# Patient Record
Sex: Female | Born: 1935
Health system: Southern US, Community
[De-identification: ages and names within clinical notes are randomized; demographics above are authoritative.]

## PROBLEM LIST (undated history)

## (undated) DIAGNOSIS — I10 Essential (primary) hypertension: Secondary | ICD-10-CM

## (undated) DIAGNOSIS — M940 Chondrocostal junction syndrome [Tietze]: Secondary | ICD-10-CM

## (undated) DIAGNOSIS — I129 Hypertensive chronic kidney disease with stage 1 through stage 4 chronic kidney disease, or unspecified chronic kidney disease: Secondary | ICD-10-CM

## (undated) DIAGNOSIS — E785 Hyperlipidemia, unspecified: Secondary | ICD-10-CM

## (undated) DIAGNOSIS — M810 Age-related osteoporosis without current pathological fracture: Secondary | ICD-10-CM

## (undated) DIAGNOSIS — N189 Chronic kidney disease, unspecified: Secondary | ICD-10-CM

## (undated) DIAGNOSIS — D509 Iron deficiency anemia, unspecified: Secondary | ICD-10-CM

## (undated) DIAGNOSIS — I340 Nonrheumatic mitral (valve) insufficiency: Secondary | ICD-10-CM

## (undated) DIAGNOSIS — M75102 Unspecified rotator cuff tear or rupture of left shoulder, not specified as traumatic: Secondary | ICD-10-CM

## (undated) DIAGNOSIS — M549 Dorsalgia, unspecified: Secondary | ICD-10-CM

## (undated) HISTORY — DX: Unspecified rotator cuff tear or rupture of left shoulder, not specified as traumatic: M75.102

## (undated) HISTORY — DX: Age-related osteoporosis without current pathological fracture: M81.0

## (undated) HISTORY — DX: Iron deficiency anemia, unspecified: D50.9

## (undated) HISTORY — DX: Chronic kidney disease, unspecified: N18.9

## (undated) HISTORY — PX: ABDOMINAL HYSTERECTOMY: SHX81

## (undated) HISTORY — DX: Hyperlipidemia, unspecified: E78.5

## (undated) HISTORY — PX: CARPAL TUNNEL RELEASE: SHX101

## (undated) HISTORY — DX: Essential (primary) hypertension: I10

## (undated) HISTORY — DX: Hypertensive chronic kidney disease with stage 1 through stage 4 chronic kidney disease, or unspecified chronic kidney disease: I12.9

## (undated) HISTORY — DX: Nonrheumatic mitral (valve) insufficiency: I34.0

## (undated) HISTORY — PX: BLADDER REPAIR: SHX76

## (undated) HISTORY — DX: Dorsalgia, unspecified: M54.9

## (undated) HISTORY — DX: Chondrocostal junction syndrome (tietze): M94.0

---

## 2004-11-22 ENCOUNTER — Inpatient Hospital Stay: Payer: Self-pay | Admitting: Internal Medicine

## 2004-11-22 ENCOUNTER — Other Ambulatory Visit: Payer: Self-pay

## 2004-11-27 ENCOUNTER — Ambulatory Visit: Payer: Self-pay | Admitting: Internal Medicine

## 2004-11-28 ENCOUNTER — Ambulatory Visit: Payer: Self-pay | Admitting: Family Medicine

## 2008-02-11 ENCOUNTER — Ambulatory Visit: Payer: Self-pay | Admitting: Dermatology

## 2013-10-20 ENCOUNTER — Emergency Department: Payer: Self-pay | Admitting: Emergency Medicine

## 2013-10-20 LAB — COMPREHENSIVE METABOLIC PANEL
ALT: 12 U/L (ref 12–78)
ANION GAP: 5 — AB (ref 7–16)
Albumin: 3.8 g/dL (ref 3.4–5.0)
Alkaline Phosphatase: 60 U/L
BUN: 13 mg/dL (ref 7–18)
Bilirubin,Total: 0.3 mg/dL (ref 0.2–1.0)
CALCIUM: 9.1 mg/dL (ref 8.5–10.1)
CHLORIDE: 105 mmol/L (ref 98–107)
CO2: 29 mmol/L (ref 21–32)
Creatinine: 0.75 mg/dL (ref 0.60–1.30)
EGFR (Non-African Amer.): >60
GLUCOSE: 111 mg/dL — AB (ref 65–99)
Osmolality: 278 (ref 275–301)
POTASSIUM: 4.2 mmol/L (ref 3.5–5.1)
SGOT(AST): 12 U/L — ABNORMAL LOW (ref 15–37)
Sodium: 139 mmol/L (ref 136–145)
TOTAL PROTEIN: 7.2 g/dL (ref 6.4–8.2)

## 2013-10-20 LAB — CBC
HCT: 24.3 % — AB (ref 35.0–47.0)
HGB: 6.7 g/dL — ABNORMAL LOW (ref 12.0–16.0)
MCH: 18.1 pg — AB (ref 26.0–34.0)
MCHC: 27.5 g/dL — ABNORMAL LOW (ref 32.0–36.0)
MCV: 66 fL — ABNORMAL LOW (ref 80–100)
Platelet: 409 10*3/uL (ref 150–440)
RBC: 3.69 10*6/uL — AB (ref 3.80–5.20)
RDW: 19.1 % — ABNORMAL HIGH (ref 11.5–14.5)
WBC: 4.9 10*3/uL (ref 3.6–11.0)

## 2013-10-20 LAB — HEMOGLOBIN: HGB: 7.9 g/dL — AB (ref 12.0–16.0)

## 2014-03-23 ENCOUNTER — Ambulatory Visit: Payer: Self-pay

## 2014-03-23 LAB — FERRITIN: Ferritin (ARMC): 16 ng/mL (ref 8–388)

## 2014-03-23 LAB — IRON: IRON: 265 ug/dL — AB (ref 50–170)

## 2014-09-16 ENCOUNTER — Encounter: Payer: Self-pay | Admitting: *Deleted

## 2014-09-16 DIAGNOSIS — N182 Chronic kidney disease, stage 2 (mild): Secondary | ICD-10-CM | POA: Insufficient documentation

## 2014-09-16 DIAGNOSIS — E785 Hyperlipidemia, unspecified: Secondary | ICD-10-CM | POA: Insufficient documentation

## 2014-09-16 DIAGNOSIS — D509 Iron deficiency anemia, unspecified: Secondary | ICD-10-CM | POA: Insufficient documentation

## 2014-09-16 DIAGNOSIS — D649 Anemia, unspecified: Secondary | ICD-10-CM | POA: Insufficient documentation

## 2014-09-16 DIAGNOSIS — M75102 Unspecified rotator cuff tear or rupture of left shoulder, not specified as traumatic: Secondary | ICD-10-CM

## 2014-09-16 DIAGNOSIS — I129 Hypertensive chronic kidney disease with stage 1 through stage 4 chronic kidney disease, or unspecified chronic kidney disease: Secondary | ICD-10-CM

## 2014-09-25 NOTE — Consult Note (Signed)
PATIENT NAME:  Judith Hickman, Judith Hickman MR#:  [id number] DATE OF BIRTH:  1933/08/03  DATE OF CONSULTATION:  10/20/2013  CONSULTING PHYSICIAN:  Pau Banh R. [place] Basham, MD  PRIMARY CARE PHYSICIAN: Steele Sizer, MD  CHIEF COMPLAINT: Weakness, shortness of breath, anemia.   HISTORY OF PRESENT ILLNESS: A 81 year old Caucasian female patient with history of prior gastric ulcer, iron deficiency anemia, sent in by primary care physician for anemia to get blood transfusion. The patient had anemia initially in 2006 when she had an upper GI endoscopy and colonoscopy. Colonoscopy was normal, but upper GI endoscopy showed a small clean-based ulcer. She also got a capsule endoscopy as outpatient. Was transfused blood, later placed on iron pills. These were slowly reduced in dose and 6 months back, her iron pills were completely stopped. She went in today for repeat blood work and hemoglobin was 6.3. Today in the Emergency Room on repeat check, it is 6.7. The patient's hemoglobin baseline seems to be between 7 and 8. Stool Hemoccult done in the ER is negative. Hospitalist team has been consulted for input regarding this case.   The patient does not have any chest pain, palpitations. Does have shortness of breath and severe fatigue. No black stools, hematemesis, nausea, vomiting, abdominal pain. No menorrhagia, vaginal bleed, or hematuria.   PAST MEDICAL HISTORY: 1.  Hypertension.  2.  Carpal tunnel surgery. 3.  Gastric ulcer.  4.  Iron deficiency chronic anemia.  5.  Arthritis.  6.  Hysterectomy.   FAMILY HISTORY: Father died at age of 22 of MI. Mother died secondary to pneumonia and complications at age of 66.   SOCIAL HISTORY: The patient does not smoke. No alcohol. No illicit drugs. Lives alone.   REVIEW OF SYSTEMS:  CONSTITUTIONAL: Complains of fatigue, weakness.  EYES: No blurred vision, pain, redness.  ENT: No tinnitus, ear pain, hearing loss. RESPIRATORY: No cough, wheezing, hemoptysis. CARDIOVASCULAR: No  chest pain, orthopnea, edema.  GASTROINTESTINAL: No nausea, vomiting, diarrhea, abdominal pain.  GENITOURINARY: No dysuria, hematuria, frequency.  ENDOCRINE: No polyuria, nocturia, thyroid problems. HEMATOLOGIC: Has anemia. No bleeding, bruising.  INTEGUMENTARY: No acne, rash, lesion.  MUSCULOSKELETAL: Has arthritis.  NEUROLOGIC: No focal numbness, weakness, seizures.   HOME MEDICATIONS: 1.  Acetaminophen 650 mg 2 times a day as needed.  2.  Evista 60 mg oral once a day. 3.  Fish oil 1000 mg 3 times a day. 4.  Glucosamine 500 mg 2 tablets oral 3 times a day.  5.  Calcium with vitamin D 1 tablet 3 times a day. 6.  Multivitamin 1 tablet daily.   ALLERGIES: No known drug allergies.   PHYSICAL EXAMINATION: VITAL SIGNS: Temperature of 98.5, pulse 62, blood pressure 144/57, saturating 94% on room air.  GENERAL: Elderly Caucasian female patient, looks pale, lying in bed, seems comfortable, conversational, cooperative with exam.  PSYCHIATRIC: Alert and oriented x 3. Mood and affect appropriate. Judgment intact.  HEENT: Atraumatic, normocephalic. Oral mucosa moist and pink. External ears and nose normal. Pallor positive. No icterus. Pupils bilaterally equal and reactive to light.  NECK: Supple. No thyromegaly or palpable lymph nodes. Trachea midline. No carotid bruit, JVD.  CARDIOVASCULAR: S1, S2, without any murmurs. Peripheral pulses 2+. No edema.  RESPIRATORY: Normal work of breathing. Clear to auscultation both sides.  GASTROINTESTINAL: Soft abdomen, nontender. Bowel sounds present. No organomegaly palpable.  SKIN: Warm and dry. No petechiae, rash, ulcers.  MUSCULOSKELETAL: No joint swelling, redness of large joints. Normal muscle tone.  NEUROLOGIC: Motor strength 5/5 in upper and  lower extremities. Sensation to fine touch intact all over.  LYMPHATIC: No cervical lymphadenopathy.   LABORATORY DATA:  Glucose 111, BUN 13, creatinine 0.75, sodium 139, potassium 4.2. AST, ALT, alkaline  phosphatase, and bilirubin normal.   WBC 4.9, hemoglobin 6.7, platelets of 409 with MCV 66.   Blood group of O positive.   Stool Hemoccult is negative.   ASSESSMENT AND PLAN: 291.  A 81 year old lady with chronic iron deficiency anemia. The patient's iron pills were stopped 6 months prior. Her hemoglobin baseline seems to be between 7 to 8. I have discussed case with Dr. Scotty CourtStafford of Emergency Room. Will transfuse a unit of blood here in the Emergency Room,  restart her iron pills, and discharge home to follow up with her primary care physician. She does not have any acute bleeding. Vitals are stable. The patient will need repeat lab work in 2 to 4 weeks with primary care physician.  2.  Hypertension: Continue medications.   TIME SPENT TODAY ON THIS CONSULT: 45 minutes.    ____________________________ Molinda BailiffSrikar R. Henlee Donovan, MD srs:jcm D: 10/20/2013 19:54:02 ET T: 10/20/2013 21:11:16 ET JOB#: [id number]  cc: Wardell HeathSrikar R. Elpidio AnisSudini, MD, <Dictator> Steele SizerMark A. Crissman, MD Orie FishermanSRIKAR R Kale Dols MD ELECTRONICALLY SIGNED 10/30/2013 14:14

## 2014-11-08 ENCOUNTER — Other Ambulatory Visit: Payer: Self-pay | Admitting: Unknown Physician Specialty

## 2014-11-10 ENCOUNTER — Ambulatory Visit: Payer: Self-pay | Admitting: Unknown Physician Specialty

## 2014-11-17 DIAGNOSIS — M810 Age-related osteoporosis without current pathological fracture: Secondary | ICD-10-CM | POA: Insufficient documentation

## 2014-11-17 DIAGNOSIS — Z90710 Acquired absence of both cervix and uterus: Secondary | ICD-10-CM | POA: Insufficient documentation

## 2014-11-19 ENCOUNTER — Ambulatory Visit (INDEPENDENT_AMBULATORY_CARE_PROVIDER_SITE_OTHER): Payer: Medicare PPO | Admitting: Unknown Physician Specialty

## 2014-11-19 ENCOUNTER — Encounter: Payer: Self-pay | Admitting: Unknown Physician Specialty

## 2014-11-19 VITALS — BP 180/74 | HR 59 | Temp 98.3°F | Ht <= 58 in | Wt 136.2 lb

## 2014-11-19 DIAGNOSIS — N182 Chronic kidney disease, stage 2 (mild): Secondary | ICD-10-CM | POA: Diagnosis not present

## 2014-11-19 DIAGNOSIS — N183 Chronic kidney disease, stage 3 (moderate): Secondary | ICD-10-CM | POA: Diagnosis not present

## 2014-11-19 DIAGNOSIS — N184 Chronic kidney disease, stage 4 (severe): Secondary | ICD-10-CM | POA: Diagnosis not present

## 2014-11-19 DIAGNOSIS — N185 Chronic kidney disease, stage 5: Secondary | ICD-10-CM

## 2014-11-19 DIAGNOSIS — N189 Chronic kidney disease, unspecified: Secondary | ICD-10-CM | POA: Diagnosis not present

## 2014-11-19 DIAGNOSIS — I129 Hypertensive chronic kidney disease with stage 1 through stage 4 chronic kidney disease, or unspecified chronic kidney disease: Secondary | ICD-10-CM

## 2014-11-19 DIAGNOSIS — N181 Chronic kidney disease, stage 1: Secondary | ICD-10-CM | POA: Diagnosis not present

## 2014-11-19 LAB — MICROALBUMIN, URINE WAIVED
CREATININE, URINE WAIVED: 200 mg/dL (ref 10–300)
MICROALB, UR WAIVED: 30 mg/L — AB (ref 0–19)
Microalb/Creat Ratio: <30 mg/g (ref <30)

## 2014-11-19 MED ORDER — LOSARTAN POTASSIUM-HCTZ 50-12.5 MG PO TABS: 1.0000 | ORAL_TABLET | Freq: Every day | ORAL | Status: DC

## 2014-11-19 MED ORDER — AMLODIPINE BESYLATE 10 MG PO TABS: 10.0000 mg | ORAL_TABLET | Freq: Every day | ORAL | Status: DC

## 2014-11-19 MED ORDER — METOPROLOL SUCCINATE ER 50 MG PO TB24: 50.0000 mg | ORAL_TABLET | Freq: Every day | ORAL | Status: DC

## 2014-11-19 NOTE — Patient Instructions (Signed)
Hypertension Hypertension, commonly called high blood pressure, is when the force of blood pumping through your arteries is too strong. Your arteries are the blood vessels that carry blood from your heart throughout your body. A blood pressure reading consists of a higher number over a lower number, such as 110/72. The higher number (systolic) is the pressure inside your arteries when your heart pumps. The lower number (diastolic) is the pressure inside your arteries when your heart relaxes. Ideally you want your blood pressure below 120/80. Hypertension forces your heart to work harder to pump blood. Your arteries may become narrow or stiff. Having hypertension puts you at risk for heart disease, stroke, and other problems.  RISK FACTORS Some risk factors for high blood pressure are controllable. Others are not.  Risk factors you cannot control include:   Race. You may be at higher risk if you are African American.  Age. Risk increases with age.  Gender. Men are at higher risk than women before age 45 years. After age 65, women are at higher risk than men. Risk factors you can control include:  Not getting enough exercise or physical activity.  Being overweight.  Getting too much fat, sugar, calories, or salt in your diet.  Drinking too much alcohol. SIGNS AND SYMPTOMS Hypertension does not usually cause signs or symptoms. Extremely high blood pressure (hypertensive crisis) may cause headache, anxiety, shortness of breath, and nosebleed. DIAGNOSIS  To check if you have hypertension, your health care provider will measure your blood pressure while you are seated, with your arm held at the level of your heart. It should be measured at least twice using the same arm. Certain conditions can cause a difference in blood pressure between your right and left arms. A blood pressure reading that is higher than normal on one occasion does not mean that you need treatment. If one blood pressure reading  is high, ask your health care provider about having it checked again. TREATMENT  Treating high blood pressure includes making lifestyle changes and possibly taking medicine. Living a healthy lifestyle can help lower high blood pressure. You may need to change some of your habits. Lifestyle changes may include:  Following the DASH diet. This diet is high in fruits, vegetables, and whole grains. It is low in salt, red meat, and added sugars.  Getting at least 2 hours of brisk physical activity every week.  Losing weight if necessary.  Not smoking.  Limiting alcoholic beverages.  Learning ways to reduce stress. If lifestyle changes are not enough to get your blood pressure under control, your health care provider may prescribe medicine. You may need to take more than one. Work closely with your health care provider to understand the risks and benefits. HOME CARE INSTRUCTIONS  Have your blood pressure rechecked as directed by your health care provider.   Take medicines only as directed by your health care provider. Follow the directions carefully. Blood pressure medicines must be taken as prescribed. The medicine does not work as well when you skip doses. Skipping doses also puts you at risk for problems.   Do not smoke.   Monitor your blood pressure at home as directed by your health care provider. SEEK MEDICAL CARE IF:   You think you are having a reaction to medicines taken.  You have recurrent headaches or feel dizzy.  You have swelling in your ankles.  You have trouble with your vision. SEEK IMMEDIATE MEDICAL CARE IF:  You develop a severe headache or confusion.    You have unusual weakness, numbness, or feel faint.  You have severe chest or abdominal pain.  You vomit repeatedly.  You have trouble breathing. MAKE SURE YOU:   Understand these instructions.  Will watch your condition.  Will get help right away if you are not doing well or get worse. Document  Released: 05/21/2005 Document Revised: 10/05/2013 Document Reviewed: 03/13/2013 ExitCare Patient Information 2015 [employer]. This information is not intended to replace advice given to you by your health care provider. Make sure you discuss any questions you have with your health care provider. DASH Eating Plan DASH stands for "Dietary Approaches to Stop Hypertension." The DASH eating plan is a healthy eating plan that has been shown to reduce high blood pressure (hypertension). Additional health benefits may include reducing the risk of type 2 diabetes mellitus, heart disease, and stroke. The DASH eating plan may also help with weight loss. WHAT DO I NEED TO KNOW ABOUT THE DASH EATING PLAN? For the DASH eating plan, you will follow these general guidelines:  Choose foods with a percent daily value for sodium of less than 5% (as listed on the food label).  Use salt-free seasonings or herbs instead of table salt or sea salt.  Check with your health care provider or pharmacist before using salt substitutes.  Eat lower-sodium products, often labeled as "lower sodium" or "no salt added."  Eat fresh foods.  Eat more vegetables, fruits, and low-fat dairy products.  Choose whole grains. Look for the word "whole" as the first word in the ingredient list.  Choose fish and skinless chicken or turkey more often than red meat. Limit fish, poultry, and meat to 6 oz (170 g) each day.  Limit sweets, desserts, sugars, and sugary drinks.  Choose heart-healthy fats.  Limit cheese to 1 oz (28 g) per day.  Eat more home-cooked food and less restaurant, buffet, and fast food.  Limit fried foods.  Cook foods using methods other than frying.  Limit canned vegetables. If you do use them, rinse them well to decrease the sodium.  When eating at a restaurant, ask that your food be prepared with less salt, or no salt if possible. WHAT FOODS CAN I EAT? Seek help from a dietitian for individual  calorie needs. Grains Whole grain or whole wheat bread. Brown rice. Whole grain or whole wheat pasta. Quinoa, bulgur, and whole grain cereals. Low-sodium cereals. Corn or whole wheat flour tortillas. Whole grain cornbread. Whole grain crackers. Low-sodium crackers. Vegetables Fresh or frozen vegetables (raw, steamed, roasted, or grilled). Low-sodium or reduced-sodium tomato and vegetable juices. Low-sodium or reduced-sodium tomato sauce and paste. Low-sodium or reduced-sodium canned vegetables.  Fruits All fresh, canned (in natural juice), or frozen fruits. Meat and Other Protein Products Ground beef (85% or leaner), grass-fed beef, or beef trimmed of fat. Skinless chicken or turkey. Ground chicken or turkey. Pork trimmed of fat. All fish and seafood. Eggs. Dried beans, peas, or lentils. Unsalted nuts and seeds. Unsalted canned beans. Dairy Low-fat dairy products, such as skim or 1% milk, 2% or reduced-fat cheeses, low-fat ricotta or cottage cheese, or plain low-fat yogurt. Low-sodium or reduced-sodium cheeses. Fats and Oils Tub margarines without trans fats. Light or reduced-fat mayonnaise and salad dressings (reduced sodium). Avocado. Safflower, olive, or canola oils. Natural peanut or almond butter. Other Unsalted popcorn and pretzels. The items listed above may not be a complete list of recommended foods or beverages. Contact your dietitian for more options. WHAT FOODS ARE NOT RECOMMENDED? Grains White bread.   White pasta. White rice. Refined cornbread. Bagels and croissants. Crackers that contain trans fat. Vegetables Creamed or fried vegetables. Vegetables in a cheese sauce. Regular canned vegetables. Regular canned tomato sauce and paste. Regular tomato and vegetable juices. Fruits Dried fruits. Canned fruit in light or heavy syrup. Fruit juice. Meat and Other Protein Products Fatty cuts of meat. Ribs, chicken wings, bacon, sausage, bologna, salami, chitterlings, fatback, hot dogs,  bratwurst, and packaged luncheon meats. Salted nuts and seeds. Canned beans with salt. Dairy Whole or 2% milk, cream, half-and-half, and cream cheese. Whole-fat or sweetened yogurt. Full-fat cheeses or blue cheese. Nondairy creamers and whipped toppings. Processed cheese, cheese spreads, or cheese curds. Condiments Onion and garlic salt, seasoned salt, table salt, and sea salt. Canned and packaged gravies. Worcestershire sauce. Tartar sauce. Barbecue sauce. Teriyaki sauce. Soy sauce, including reduced sodium. Steak sauce. Fish sauce. Oyster sauce. Cocktail sauce. Horseradish. Ketchup and mustard. Meat flavorings and tenderizers. Bouillon cubes. Hot sauce. Tabasco sauce. Marinades. Taco seasonings. Relishes. Fats and Oils Butter, stick margarine, lard, shortening, ghee, and bacon fat. Coconut, palm kernel, or palm oils. Regular salad dressings. Other Pickles and olives. Salted popcorn and pretzels. The items listed above may not be a complete list of foods and beverages to avoid. Contact your dietitian for more information. WHERE CAN I FIND MORE INFORMATION? National Heart, Lung, and Blood Institute: www.nhlbi.nih.gov/health/health-topics/topics/dash/ Document Released: 05/10/2011 Document Revised: 10/05/2013 Document Reviewed: 03/25/2013 ExitCare Patient Information 2015 [employer]. This information is not intended to replace advice given to you by your health care provider. Make sure you discuss any questions you have with your health care provider.  

## 2014-11-19 NOTE — Progress Notes (Signed)
   BP 180/74 mmHg  Pulse 59  Temp(Src) 98.3 F (36.8 C)  Ht 4\' 10"  (1.473 m)  Wt 136 lb 3.2 oz (61.78 kg)  BMI 28.47 kg/m2  SpO2 96%  LMP  (LMP Unknown)   Subjective:    Patient ID: Judith Hickman, female    DOB: 12-Nov-1933, 81 y.o.   MRN: [id number]  HPI: Judith Hickman is a 81 y.o. female  Chief Complaint  Patient presents with  . Hypertension   Hypertension This is a chronic problem. The problem is uncontrolled. Pertinent negatives include no anxiety, blurred vision, chest pain, headaches, malaise/fatigue, neck pain, orthopnea, palpitations, peripheral edema, PND or sweats. There are no compliance problems.    BP is 132/62 at home  Relevant past medical, surgical, family and social history reviewed and updated as indicated. Interim medical history since our last visit reviewed. Allergies and medications reviewed and updated.  Review of Systems  Constitutional: Negative for malaise/fatigue.  Eyes: Negative for blurred vision.  Cardiovascular: Negative for chest pain, palpitations, orthopnea and PND.  Musculoskeletal: Negative for neck pain.  Neurological: Negative for headaches.    Per HPI unless specifically indicated above     Objective:    BP 180/74 mmHg  Pulse 59  Temp(Src) 98.3 F (36.8 C)  Ht 4\' 10"  (1.473 m)  Wt 136 lb 3.2 oz (61.78 kg)  BMI 28.47 kg/m2  SpO2 96%  LMP  (LMP Unknown)  Wt Readings from Last 3 Encounters:  11/19/14 136 lb 3.2 oz (61.78 kg)  06/08/14 135 lb (61.236 kg)  06/08/14 135 lb (61.236 kg)    Physical Exam  Constitutional: She is oriented to person, place, and time. She appears well-developed and well-nourished. No distress.  HENT:  Head: Normocephalic and atraumatic.  Eyes: Conjunctivae and lids are normal. Right eye exhibits no discharge. Left eye exhibits no discharge. No scleral icterus.  Cardiovascular: Normal rate and regular rhythm.   Murmur heard. Long standing systolic murmer  Pulmonary/Chest: Effort normal and  breath sounds normal. No respiratory distress.  Abdominal: Normal appearance. There is no splenomegaly or hepatomegaly.  Musculoskeletal: Normal range of motion.  Neurological: She is alert and oriented to person, place, and time.  Skin: Skin is intact. No rash noted. No pallor.  Psychiatric: She has a normal mood and affect. Her behavior is normal. Judgment and thought content normal.      Assessment & Plan:   Problem List Items Addressed This Visit      Genitourinary   Benign hypertensive kidney disease with chronic kidney disease - Primary    Pt with poorly controlled BP today.  Will add Losartan/HCTZ to present treatment.  Refill other BP medications.        Relevant Orders   Microalbumin, Urine Waived   Uric acid   Comprehensive metabolic panel   Lipid Panel w/o Chol/HDL Ratio       Follow up plan: Return in about 6 weeks (around 12/31/2014).

## 2014-11-19 NOTE — Assessment & Plan Note (Signed)
Pt with poorly controlled BP today.  Will add Losartan/HCTZ to present treatment.  Refill other BP medications.

## 2014-11-20 LAB — COMPREHENSIVE METABOLIC PANEL
ALBUMIN: 4.2 g/dL (ref 3.5–4.8)
ALT: 9 IU/L (ref 0–32)
AST: 15 IU/L (ref 0–40)
Albumin/Globulin Ratio: 1.6 (ref 1.1–2.5)
Alkaline Phosphatase: 65 IU/L (ref 39–117)
BUN/Creatinine Ratio: 26 (ref 11–26)
BUN: 17 mg/dL (ref 8–27)
CO2: 26 mmol/L (ref 18–29)
Calcium: 9.9 mg/dL (ref 8.7–10.3)
Chloride: 100 mmol/L (ref 97–108)
Creatinine, Ser: 0.66 mg/dL (ref 0.57–1.00)
GFR calc Af Amer: 98 mL/min/{1.73_m2} (ref >59)
GFR, EST NON AFRICAN AMERICAN: 85 mL/min/{1.73_m2} (ref >59)
Globulin, Total: 2.7 g/dL (ref 1.5–4.5)
Glucose: 100 mg/dL — ABNORMAL HIGH (ref 65–99)
POTASSIUM: 4.9 mmol/L (ref 3.5–5.2)
SODIUM: 141 mmol/L (ref 134–144)
Total Protein: 6.9 g/dL (ref 6.0–8.5)

## 2014-11-20 LAB — LIPID PANEL W/O CHOL/HDL RATIO
CHOLESTEROL TOTAL: 237 mg/dL — AB (ref 100–199)
HDL: 53 mg/dL (ref >39)
LDL CALC: 138 mg/dL — AB (ref 0–99)
TRIGLYCERIDES: 231 mg/dL — AB (ref 0–149)
VLDL Cholesterol Cal: 46 mg/dL — ABNORMAL HIGH (ref 5–40)

## 2014-11-20 LAB — URIC ACID: Uric Acid: 5.6 mg/dL (ref 2.5–7.1)

## 2014-11-22 ENCOUNTER — Telehealth: Payer: Self-pay

## 2014-11-22 NOTE — Telephone Encounter (Signed)
Tried to call patient but there was no answer and the voicemail was not set up so I was not able to leave a message. Will try again later.

## 2014-11-22 NOTE — Telephone Encounter (Signed)
-----   Message from Gabriel Cirri, NP sent at 11/22/2014  9:34 AM EDT ----- Call tell labs are normal except high cholesterol.  It is not recommended she take cholesterol medications but should work on diet and exercise

## 2014-11-23 ENCOUNTER — Telehealth: Payer: Self-pay | Admitting: Unknown Physician Specialty

## 2014-11-23 MED ORDER — TRIAMTERENE-HCTZ 50-25 MG PO CAPS: 1.0000 | ORAL_CAPSULE | ORAL | Status: DC

## 2014-11-23 NOTE — Telephone Encounter (Signed)
Pt called stated the new RX Judith Hickman started her on Friday has caused her bottom lip to swell. Pt stated she would like to have another RX called in for. Please contact pt with any issues or concerns. Pharm is [facility] on [address]. Please call pt @ [phone]. Thanks.

## 2014-11-23 NOTE — Telephone Encounter (Signed)
Tried to call patient but the voicemail was not set up so I was not able to leave a message. Will try again later.

## 2014-11-23 NOTE — Telephone Encounter (Signed)
Patient was notified of results.  

## 2014-11-23 NOTE — Telephone Encounter (Signed)
Another medication sent.  Ask her to please let us know if she has a reaction to that one.  Any symptoms of throat tightness or closing or shortness of breath, call 911

## 2014-11-23 NOTE — Telephone Encounter (Signed)
Routing to provider. The medication given to patient was the metoporolol succinate.

## 2014-11-23 NOTE — Telephone Encounter (Signed)
Called and notified patient about new medication that was sent.

## 2014-12-08 ENCOUNTER — Telehealth: Payer: Self-pay | Admitting: Unknown Physician Specialty

## 2014-12-08 MED ORDER — HYDROCHLOROTHIAZIDE 12.5 MG PO CAPS: 12.5000 mg | ORAL_CAPSULE | Freq: Every day | ORAL | Status: DC

## 2014-12-08 NOTE — Telephone Encounter (Signed)
I spoke with granddaughter; she is a Engineer, civil (consulting)nurse; believes BP may be dropping too low with new medicine orthostatics have been done in the office already STOP the triamterene/hctz 50/25 and start just hctz 12.5 mg We discussed risk of stroke, falls Offered to have her come in to check her cuff against ours Next appt is about 3 weeks away; call before then if any issues at all Granddaughter will call pt and tell her plan

## 2014-12-08 NOTE — Telephone Encounter (Signed)
Pt's granddaughter called stated she has been taking a new BP medication and since she has been taking the medication she has been feeling light headed and dizzy. Would like to know if dosage can be lowered. Please call pt @ [phone] ASAP. Pt's cell # is [phone]. Thanks.

## 2014-12-08 NOTE — Telephone Encounter (Signed)
Routing to provider. Pharmacy is [facility] on [address].

## 2014-12-11 ENCOUNTER — Other Ambulatory Visit: Payer: Self-pay | Admitting: Unknown Physician Specialty

## 2014-12-31 ENCOUNTER — Other Ambulatory Visit: Payer: Self-pay | Admitting: Unknown Physician Specialty

## 2014-12-31 ENCOUNTER — Encounter: Payer: Self-pay | Admitting: Unknown Physician Specialty

## 2014-12-31 ENCOUNTER — Ambulatory Visit (INDEPENDENT_AMBULATORY_CARE_PROVIDER_SITE_OTHER): Payer: Medicare PPO | Admitting: Unknown Physician Specialty

## 2014-12-31 VITALS — BP 150/72 | HR 59 | Temp 98.2°F | Ht 58.6 in | Wt 130.6 lb

## 2014-12-31 DIAGNOSIS — N183 Chronic kidney disease, stage 3 (moderate): Secondary | ICD-10-CM | POA: Diagnosis not present

## 2014-12-31 DIAGNOSIS — N182 Chronic kidney disease, stage 2 (mild): Secondary | ICD-10-CM

## 2014-12-31 DIAGNOSIS — I129 Hypertensive chronic kidney disease with stage 1 through stage 4 chronic kidney disease, or unspecified chronic kidney disease: Secondary | ICD-10-CM | POA: Diagnosis not present

## 2014-12-31 DIAGNOSIS — N185 Chronic kidney disease, stage 5: Secondary | ICD-10-CM

## 2014-12-31 DIAGNOSIS — N189 Chronic kidney disease, unspecified: Secondary | ICD-10-CM

## 2014-12-31 DIAGNOSIS — N181 Chronic kidney disease, stage 1: Secondary | ICD-10-CM | POA: Diagnosis not present

## 2014-12-31 DIAGNOSIS — N184 Chronic kidney disease, stage 4 (severe): Secondary | ICD-10-CM | POA: Diagnosis not present

## 2014-12-31 MED ORDER — RALOXIFENE HCL 60 MG PO TABS: 60.0000 mg | ORAL_TABLET | Freq: Every day | ORAL | Status: DC

## 2014-12-31 MED ORDER — METOPROLOL SUCCINATE ER 50 MG PO TB24: 50.0000 mg | ORAL_TABLET | Freq: Every day | ORAL | Status: DC

## 2014-12-31 MED ORDER — AMLODIPINE BESYLATE 10 MG PO TABS
10.0000 mg | ORAL_TABLET | Freq: Every day | ORAL | Status: DC
Start: 2014-12-31 — End: 2015-11-08

## 2014-12-31 MED ORDER — HYDROCHLOROTHIAZIDE 12.5 MG PO CAPS: 12.5000 mg | ORAL_CAPSULE | Freq: Every day | ORAL | Status: DC

## 2014-12-31 NOTE — Assessment & Plan Note (Signed)
Stable on present meds.

## 2014-12-31 NOTE — Progress Notes (Signed)
BP 150/72 mmHg  Pulse 59  Temp(Src) 98.2 F (36.8 C)  Ht 4' 10.6" (1.488 m)  Wt 130 lb 9.6 oz (59.24 kg)  BMI 26.76 kg/m2  SpO2 97%  LMP  (LMP Unknown)   Subjective:    Patient ID: Judith Hickman, female    DOB: 05/23/1934, 81 y.o.   MRN: [id number]  HPI: Judith Hickman is a 81 y.o. female  Chief Complaint  Patient presents with  . Hypertension   BP meds adjusted last visit but pt got dizzy.  Her daughter felt her BP was too low.  HCTZ 12.5 mg was called in and Losartan discontinued.  She is doing well.  No chest pain or SOB.  Taking BP meds daily.    Relevant past medical, surgical, family and social history reviewed and updated as indicated. Interim medical history since our last visit reviewed. Allergies and medications reviewed and updated.  Review of Systems  Per HPI unless specifically indicated above     Objective:    BP 150/72 mmHg  Pulse 59  Temp(Src) 98.2 F (36.8 C)  Ht 4' 10.6" (1.488 m)  Wt 130 lb 9.6 oz (59.24 kg)  BMI 26.76 kg/m2  SpO2 97%  LMP  (LMP Unknown)  Wt Readings from Last 3 Encounters:  12/31/14 130 lb 9.6 oz (59.24 kg)  11/19/14 136 lb 3.2 oz (61.78 kg)  06/08/14 135 lb (61.236 kg)    Physical Exam  Constitutional: She is oriented to person, place, and time. She appears well-developed and well-nourished. No distress.  HENT:  Head: Normocephalic and atraumatic.  Eyes: Conjunctivae and lids are normal. Right eye exhibits no discharge. Left eye exhibits no discharge. No scleral icterus.  Cardiovascular: Normal rate, regular rhythm and normal heart sounds.   Pulmonary/Chest: Effort normal and breath sounds normal. No respiratory distress.  Abdominal: Normal appearance and bowel sounds are normal. She exhibits no distension. There is no splenomegaly or hepatomegaly. There is no tenderness.  Musculoskeletal: Normal range of motion.  Neurological: She is alert and oriented to person, place, and time.  Skin: Skin is intact. No rash  noted. No pallor.  Psychiatric: She has a normal mood and affect. Her behavior is normal. Judgment and thought content normal.    Results for orders placed or performed in visit on 11/19/14  Microalbumin, Urine Waived  Result Value Ref Range   Microalb, Ur Waived 30 (H) 0 - 19 mg/L   Creatinine, Urine Waived 200 10 - 300 mg/dL   Microalb/Creat Ratio <30 <30 mg/g  Uric acid  Result Value Ref Range   Uric Acid 5.6 2.5 - 7.1 mg/dL  Comprehensive metabolic panel  Result Value Ref Range   Glucose 100 (H) 65 - 99 mg/dL   BUN 17 8 - 27 mg/dL   Creatinine, Ser 1.91 0.57 - 1.00 mg/dL   GFR calc non Af Amer 85 >59 mL/min/1.73   GFR calc Af Amer 98 >59 mL/min/1.73   BUN/Creatinine Ratio 26 11 - 26   Sodium 141 134 - 144 mmol/L   Potassium 4.9 3.5 - 5.2 mmol/L   Chloride 100 97 - 108 mmol/L   CO2 26 18 - 29 mmol/L   Calcium 9.9 8.7 - 10.3 mg/dL   Total Protein 6.9 6.0 - 8.5 g/dL   Albumin 4.2 3.5 - 4.8 g/dL   Globulin, Total 2.7 1.5 - 4.5 g/dL   Albumin/Globulin Ratio 1.6 1.1 - 2.5   Bilirubin Total <0.2 0.0 - 1.2 mg/dL   Alkaline  Phosphatase 65 39 - 117 IU/L   AST 15 0 - 40 IU/L   ALT 9 0 - 32 IU/L  Lipid Panel w/o Chol/HDL Ratio  Result Value Ref Range   Cholesterol, Total 237 (H) 100 - 199 mg/dL   Triglycerides 161 (H) 0 - 149 mg/dL   HDL 53 >09 mg/dL   VLDL Cholesterol Cal 46 (H) 5 - 40 mg/dL   LDL Calculated 604 (H) 0 - 99 mg/dL      Assessment & Plan:   Problem List Items Addressed This Visit      Unprioritized   Benign hypertensive kidney disease with chronic kidney disease - Primary    Stable on present meds.            Follow up plan: Return in about 6 months (around 07/03/2015) for physical.

## 2015-02-22 DIAGNOSIS — M1710 Unilateral primary osteoarthritis, unspecified knee: Secondary | ICD-10-CM | POA: Diagnosis not present

## 2015-02-22 DIAGNOSIS — Z6827 Body mass index (BMI) 27.0-27.9, adult: Secondary | ICD-10-CM | POA: Diagnosis not present

## 2015-02-22 DIAGNOSIS — E663 Overweight: Secondary | ICD-10-CM | POA: Diagnosis not present

## 2015-02-22 DIAGNOSIS — I10 Essential (primary) hypertension: Secondary | ICD-10-CM | POA: Diagnosis not present

## 2015-02-22 DIAGNOSIS — E785 Hyperlipidemia, unspecified: Secondary | ICD-10-CM | POA: Diagnosis not present

## 2015-02-22 DIAGNOSIS — M810 Age-related osteoporosis without current pathological fracture: Secondary | ICD-10-CM | POA: Diagnosis not present

## 2015-06-28 ENCOUNTER — Encounter: Payer: Medicare PPO | Admitting: Unknown Physician Specialty

## 2015-07-27 ENCOUNTER — Ambulatory Visit (INDEPENDENT_AMBULATORY_CARE_PROVIDER_SITE_OTHER): Payer: Medicare PPO | Admitting: Unknown Physician Specialty

## 2015-07-27 ENCOUNTER — Encounter: Payer: Self-pay | Admitting: Unknown Physician Specialty

## 2015-07-27 VITALS — BP 152/81 | HR 60 | Temp 98.3°F | Ht <= 58 in | Wt 134.6 lb

## 2015-07-27 DIAGNOSIS — Z0000 Encounter for general adult medical examination without abnormal findings: Secondary | ICD-10-CM

## 2015-07-27 DIAGNOSIS — Z23 Encounter for immunization: Secondary | ICD-10-CM

## 2015-07-27 DIAGNOSIS — I10 Essential (primary) hypertension: Secondary | ICD-10-CM | POA: Insufficient documentation

## 2015-07-27 DIAGNOSIS — E1122 Type 2 diabetes mellitus with diabetic chronic kidney disease: Secondary | ICD-10-CM | POA: Diagnosis not present

## 2015-07-27 DIAGNOSIS — D509 Iron deficiency anemia, unspecified: Secondary | ICD-10-CM | POA: Diagnosis not present

## 2015-07-27 DIAGNOSIS — N182 Chronic kidney disease, stage 2 (mild): Secondary | ICD-10-CM

## 2015-07-27 NOTE — Assessment & Plan Note (Signed)
Not quite to goal but typical for her

## 2015-07-27 NOTE — Patient Instructions (Signed)
Advance Directive Advance directives are the legal documents that allow you to make choices about your health care and medical treatment if you cannot speak for yourself. Advance directives are a way for you to communicate your wishes to family, friends, and health care providers. The specified people can then convey your decisions about end-of-life care to avoid confusion if you should become unable to communicate. Ideally, the process of discussing and writing advance directives should happen over time rather than making decisions all at once. Advance directives can be modified as your situation changes, and you can change your mind at any time, even after you have signed the advance directives. Each state has its own laws regarding advance directives. You may want to check with your health care provider, attorney, or state representative about the law in your state. Below are some examples of advance directives. LIVING WILL A living will is a set of instructions documenting your wishes about medical care when you cannot care for yourself. It is used if you become:  Terminally ill.  Incapacitated.  Unable to communicate.  Unable to make decisions. Items to consider in your living will include:  The use or non-use of life-sustaining equipment, such as dialysis machines and breathing machines (ventilators).  A do not resuscitate (DNR) order, which is the instruction not to use cardiopulmonary resuscitation (CPR) if breathing or heartbeat stops.  Tube feeding.  Withholding of food and fluids.  Comfort (palliative) care when the goal becomes comfort rather than a cure.  Organ and tissue donation. A living will does not give instructions about distribution of your money and property if you should pass away. It is advisable to seek the expert advice of a lawyer in drawing up a will regarding your possessions. Decisions about taxes, beneficiaries, and asset distribution will be legally binding.  This process can relieve your family and friends of any burdens surrounding disputes or questions that may come up about the allocation of your assets. DO NOT RESUSCITATE (DNR) A do not resuscitate (DNR) order is a request to not have CPR in the event that your heart stops beating or you stop breathing. Unless given other instructions, a health care provider will try to help any patient whose heart has stopped or who has stopped breathing.  HEALTH CARE PROXY AND DURABLE POWER OF ATTORNEY FOR HEALTH CARE A health care proxy is a person (agent) appointed to make medical decisions for you if you cannot. Generally, people choose someone they know well and trust to represent their preferences when they can no longer do so. You should be sure to ask this person for agreement to act as your agent. An agent may have to exercise judgment in the event of a medical decision for which your wishes are not known. The durable power of attorney for health care is the legal document that names your health care proxy. Once written, it should be:  Signed.  Notarized.  Dated.  Copied.  Witnessed.  Incorporated into your medical record. You may also want to appoint someone to manage your financial affairs if you cannot. This is called a durable power of attorney for finances. It is a separate legal document from the durable power of attorney for health care. You may choose the same person or someone different from your health care proxy to act as your agent in financial matters.   This information is not intended to replace advice given to you by your health care provider. Make sure you discuss any   questions you have with your health care provider.   Document Released: 08/28/2007 Document Revised: 05/26/2013 Document Reviewed: 10/08/2012 Elsevier Interactive Patient Education 2016 [employer]  

## 2015-07-27 NOTE — Progress Notes (Signed)
BP 152/81 mmHg  Pulse 60  Temp(Src) 98.3 F (36.8 C)  Ht 4' 9.7" (1.466 m)  Wt 134 lb 9.6 oz (61.054 kg)  BMI 28.41 kg/m2  SpO2 94%  LMP  (LMP Unknown)   Subjective:    Patient ID: Judith Hickman, female    DOB: December 15, 1933, 82 y.o.   MRN: [id number]  HPI: Judith Hickman is a 82 y.o. female  Chief Complaint  Patient presents with  . Medicare Wellness   Functional Status Survey: Is the patient deaf or have difficulty hearing?: Yes Does the patient have difficulty seeing, even when wearing glasses/contacts?: No Does the patient have difficulty concentrating, remembering, or making decisions?: No Does the patient have difficulty walking or climbing stairs?: No Does the patient have difficulty dressing or bathing?: No Does the patient have difficulty doing errands alone such as visiting a doctor's office or shopping?: No  Fall Risk  07/27/2015  Falls in the past year? No   Depression screen [facility] 2/9 07/27/2015 12/31/2014 11/19/2014  Decreased Interest 0 0 0  Down, Depressed, Hopeless 0 0 0  PHQ - 2 Score 0 0 0    Hypertension Using medications without difficulty Average home BPs   No problems or lightheadedness No chest pain with exertion or shortness of breath No Edema  No living will or POA.  Pt states she would like her granddaughter Morrie Sheldon to make decisions for her  Relevant past medical, surgical, family and social history reviewed and updated as indicated. Interim medical history since our last visit reviewed. Allergies and medications reviewed and updated.  Review of Systems  Per HPI unless specifically indicated above     Objective:    BP 152/81 mmHg  Pulse 60  Temp(Src) 98.3 F (36.8 C)  Ht 4' 9.7" (1.466 m)  Wt 134 lb 9.6 oz (61.054 kg)  BMI 28.41 kg/m2  SpO2 94%  LMP  (LMP Unknown)  Wt Readings from Last 3 Encounters:  07/27/15 134 lb 9.6 oz (61.054 kg)  12/31/14 130 lb 9.6 oz (59.24 kg)  11/19/14 136 lb 3.2 oz (61.78 kg)    Physical Exam   Constitutional: She is oriented to person, place, and time. She appears well-developed and well-nourished. No distress.  HENT:  Head: Normocephalic and atraumatic.  Eyes: Conjunctivae and lids are normal. Right eye exhibits no discharge. Left eye exhibits no discharge. No scleral icterus.  Neck: Normal range of motion. Neck supple. No JVD present. Carotid bruit is not present.  Cardiovascular: Normal rate, regular rhythm and normal heart sounds.   Pulmonary/Chest: Effort normal and breath sounds normal.  Abdominal: Normal appearance. There is no splenomegaly or hepatomegaly.  Musculoskeletal: Normal range of motion.  Neurological: She is alert and oriented to person, place, and time.  Skin: Skin is warm, dry and intact. No rash noted. No pallor.  Psychiatric: She has a normal mood and affect. Her behavior is normal. Judgment and thought content normal.    Results for orders placed or performed in visit on 11/19/14  Microalbumin, Urine Waived  Result Value Ref Range   Microalb, Ur Waived 30 (H) 0 - 19 mg/L   Creatinine, Urine Waived 200 10 - 300 mg/dL   Microalb/Creat Ratio <30 <30 mg/g  Uric acid  Result Value Ref Range   Uric Acid 5.6 2.5 - 7.1 mg/dL  Comprehensive metabolic panel  Result Value Ref Range   Glucose 100 (H) 65 - 99 mg/dL   BUN 17 8 - 27 mg/dL  Creatinine, Ser 0.66 0.57 - 1.00 mg/dL   GFR calc non Af Amer 85 >59 mL/min/1.73   GFR calc Af Amer 98 >59 mL/min/1.73   BUN/Creatinine Ratio 26 11 - 26   Sodium 141 134 - 144 mmol/L   Potassium 4.9 3.5 - 5.2 mmol/L   Chloride 100 97 - 108 mmol/L   CO2 26 18 - 29 mmol/L   Calcium 9.9 8.7 - 10.3 mg/dL   Total Protein 6.9 6.0 - 8.5 g/dL   Albumin 4.2 3.5 - 4.8 g/dL   Globulin, Total 2.7 1.5 - 4.5 g/dL   Albumin/Globulin Ratio 1.6 1.1 - 2.5   Bilirubin Total <0.2 0.0 - 1.2 mg/dL   Alkaline Phosphatase 65 39 - 117 IU/L   AST 15 0 - 40 IU/L   ALT 9 0 - 32 IU/L  Lipid Panel w/o Chol/HDL Ratio  Result Value Ref Range    Cholesterol, Total 237 (H) 100 - 199 mg/dL   Triglycerides 161 (H) 0 - 149 mg/dL   HDL 53 >09 mg/dL   VLDL Cholesterol Cal 46 (H) 5 - 40 mg/dL   LDL Calculated 604 (H) 0 - 99 mg/dL      Assessment & Plan:   Problem List Items Addressed This Visit      Unprioritized   Fe deficiency anemia    Check CBC      Essential hypertension, benign    Not quite to goal but typical for her      Relevant Orders   Comprehensive metabolic panel   Lipid Panel w/o Chol/HDL Ratio   CKD stage 2 due to type 2 diabetes mellitus (HCC)   Relevant Orders   CBC with Differential/Platelet    Other Visit Diagnoses    Need for pneumococcal vaccination    -  Primary    Relevant Orders    Pneumococcal conjugate vaccine 13-valent IM (Completed)    Routine general medical examination at a health care facility            Follow up plan: Return in about 3 months (around 10/24/2015).

## 2015-07-27 NOTE — Assessment & Plan Note (Signed)
Check CBC 

## 2015-07-28 LAB — CBC WITH DIFFERENTIAL/PLATELET
BASOS: 1 %
Basophils Absolute: 0 10*3/uL (ref 0.0–0.2)
EOS (ABSOLUTE): 0.1 10*3/uL (ref 0.0–0.4)
EOS: 2 %
HEMOGLOBIN: 13 g/dL (ref 11.1–15.9)
Hematocrit: 39.7 % (ref 34.0–46.6)
IMMATURE GRANS (ABS): 0 10*3/uL (ref 0.0–0.1)
IMMATURE GRANULOCYTES: 0 %
Lymphocytes Absolute: 1.9 10*3/uL (ref 0.7–3.1)
Lymphs: 32 %
MCH: 28.6 pg (ref 26.6–33.0)
MCHC: 32.7 g/dL (ref 31.5–35.7)
MCV: 87 fL (ref 79–97)
MONOCYTES: 5 %
Monocytes Absolute: 0.3 10*3/uL (ref 0.1–0.9)
NEUTROS PCT: 60 %
Neutrophils Absolute: 3.5 10*3/uL (ref 1.4–7.0)
PLATELETS: 293 10*3/uL (ref 150–379)
RBC: 4.55 x10E6/uL (ref 3.77–5.28)
RDW: 14.3 % (ref 12.3–15.4)
WBC: 5.9 10*3/uL (ref 3.4–10.8)

## 2015-07-28 LAB — COMPREHENSIVE METABOLIC PANEL
ALBUMIN: 4.4 g/dL (ref 3.5–4.8)
ALT: 12 IU/L (ref 0–32)
AST: 16 IU/L (ref 0–40)
Albumin/Globulin Ratio: 1.5 (ref 1.1–2.5)
Alkaline Phosphatase: 65 IU/L (ref 39–117)
BUN/Creatinine Ratio: 21 (ref 11–26)
BUN: 15 mg/dL (ref 8–27)
Bilirubin Total: 0.2 mg/dL (ref 0.0–1.2)
CHLORIDE: 99 mmol/L (ref 96–106)
CO2: 26 mmol/L (ref 18–29)
Calcium: 9.7 mg/dL (ref 8.7–10.3)
Creatinine, Ser: 0.7 mg/dL (ref 0.57–1.00)
GFR calc non Af Amer: 83 mL/min/{1.73_m2} (ref >59)
GFR, EST AFRICAN AMERICAN: 95 mL/min/{1.73_m2} (ref >59)
GLUCOSE: 98 mg/dL (ref 65–99)
Globulin, Total: 2.9 g/dL (ref 1.5–4.5)
Potassium: 4.6 mmol/L (ref 3.5–5.2)
Sodium: 141 mmol/L (ref 134–144)
TOTAL PROTEIN: 7.3 g/dL (ref 6.0–8.5)

## 2015-07-28 LAB — LIPID PANEL W/O CHOL/HDL RATIO
CHOLESTEROL TOTAL: 153 mg/dL (ref 100–199)
HDL: 58 mg/dL (ref >39)
LDL CALC: 69 mg/dL (ref 0–99)
Triglycerides: 129 mg/dL (ref 0–149)
VLDL CHOLESTEROL CAL: 26 mg/dL (ref 5–40)

## 2015-07-29 ENCOUNTER — Encounter: Payer: Self-pay | Admitting: Unknown Physician Specialty

## 2015-07-29 NOTE — Progress Notes (Signed)
Quick Note:  Normal labs. Patient notified by letter. ______ 

## 2015-10-25 ENCOUNTER — Ambulatory Visit: Payer: Medicare PPO | Admitting: Unknown Physician Specialty

## 2015-11-08 ENCOUNTER — Ambulatory Visit (INDEPENDENT_AMBULATORY_CARE_PROVIDER_SITE_OTHER): Payer: Medicare PPO | Admitting: Unknown Physician Specialty

## 2015-11-08 ENCOUNTER — Encounter: Payer: Self-pay | Admitting: Unknown Physician Specialty

## 2015-11-08 VITALS — BP 144/64 | HR 56 | Temp 98.2°F | Ht 59.0 in | Wt 134.4 lb

## 2015-11-08 DIAGNOSIS — I10 Essential (primary) hypertension: Secondary | ICD-10-CM | POA: Diagnosis not present

## 2015-11-08 MED ORDER — HYDROCHLOROTHIAZIDE 12.5 MG PO CAPS: 12.5000 mg | ORAL_CAPSULE | Freq: Every day | ORAL | Status: DC

## 2015-11-08 MED ORDER — AMLODIPINE BESYLATE 10 MG PO TABS: 10.0000 mg | ORAL_TABLET | Freq: Every day | ORAL | Status: DC

## 2015-11-08 MED ORDER — METOPROLOL SUCCINATE ER 50 MG PO TB24: 50.0000 mg | ORAL_TABLET | Freq: Every day | ORAL | Status: DC

## 2015-11-08 NOTE — Assessment & Plan Note (Signed)
Stable, continue present medications.   

## 2015-11-08 NOTE — Progress Notes (Signed)
BP 144/64 mmHg  Pulse 56  Temp(Src) 98.2 F (36.8 C)  Ht  (1.499 m)  Wt 134 lb 6.4 oz (60.963 kg)  BMI 27.13 kg/m2  SpO2 95%  LMP  (LMP Unknown)   Subjective:    Patient ID: Judith Hickman, female    DOB: [birth year], 82 y.o.   MRN: [id number]  HPI: Judith Hickman is a 82 y.o. female  Chief Complaint  Patient presents with  . Follow-up  . Hypertension   Hypertension Using medications without difficulty Average home BPs 143/74  No problems or lightheadedness No chest pain with exertion or shortness of breath No Edema    Relevant past medical, surgical, family and social history reviewed and updated as indicated. Interim medical history since our last visit reviewed. Allergies and medications reviewed and updated.  Review of Systems  Per HPI unless specifically indicated above     Objective:    BP 144/64 mmHg  Pulse 56  Temp(Src) 98.2 F (36.8 C)  Ht  (1.499 m)  Wt 134 lb 6.4 oz (60.963 kg)  BMI 27.13 kg/m2  SpO2 95%  LMP  (LMP Unknown)  Wt Readings from Last 3 Encounters:  11/08/15 134 lb 6.4 oz (60.963 kg)  07/27/15 134 lb 9.6 oz (61.054 kg)  12/31/14 130 lb 9.6 oz (59.24 kg)    Physical Exam  Constitutional: She is oriented to person, place, and time. She appears well-developed and well-nourished. No distress.  HENT:  Head: Normocephalic and atraumatic.  Eyes: Conjunctivae and lids are normal. Right eye exhibits no discharge. Left eye exhibits no discharge. No scleral icterus.  Neck: Normal range of motion. Neck supple. No JVD present. Carotid bruit is not present.  Cardiovascular: Normal rate, regular rhythm and normal heart sounds.   Pulmonary/Chest: Effort normal and breath sounds normal.  Abdominal: Normal appearance. There is no splenomegaly or hepatomegaly.  Musculoskeletal: Normal range of motion.  Neurological: She is alert and oriented to person, place, and time.  Skin: Skin is warm, dry and intact. No rash noted. No pallor.   Psychiatric: She has a normal mood and affect. Her behavior is normal. Judgment and thought content normal.    Results for orders placed or performed in visit on 07/27/15  CBC with Differential/Platelet  Result Value Ref Range   WBC 5.9 3.4 - 10.8 x10E3/uL   RBC 4.55 3.77 - 5.28 x10E6/uL   Hemoglobin 13.0 11.1 - 15.9 g/dL   Hematocrit 59.5 63.8 - 46.6 %   MCV 87 79 - 97 fL   MCH 28.6 26.6 - 33.0 pg   MCHC 32.7 31.5 - 35.7 g/dL   RDW 75.6 43.3 - 29.5 %   Platelets 293 150 - 379 x10E3/uL   Neutrophils 60 %   Lymphs 32 %   Monocytes 5 %   Eos 2 %   Basos 1 %   Neutrophils Absolute 3.5 1.4 - 7.0 x10E3/uL   Lymphocytes Absolute 1.9 0.7 - 3.1 x10E3/uL   Monocytes Absolute 0.3 0.1 - 0.9 x10E3/uL   EOS (ABSOLUTE) 0.1 0.0 - 0.4 x10E3/uL   Basophils Absolute 0.0 0.0 - 0.2 x10E3/uL   Immature Granulocytes 0 %   Immature Grans (Abs) 0.0 0.0 - 0.1 x10E3/uL  Comprehensive metabolic panel  Result Value Ref Range   Glucose 98 65 - 99 mg/dL   BUN 15 8 - 27 mg/dL   Creatinine, Ser 1.88 0.57 - 1.00 mg/dL   GFR calc non Af Amer 83 >59 mL/min/1.73  GFR calc Af Amer 95 >59 mL/min/1.73   BUN/Creatinine Ratio 21 11 - 26   Sodium 141 134 - 144 mmol/L   Potassium 4.6 3.5 - 5.2 mmol/L   Chloride 99 96 - 106 mmol/L   CO2 26 18 - 29 mmol/L   Calcium 9.7 8.7 - 10.3 mg/dL   Total Protein 7.3 6.0 - 8.5 g/dL   Albumin 4.4 3.5 - 4.8 g/dL   Globulin, Total 2.9 1.5 - 4.5 g/dL   Albumin/Globulin Ratio 1.5 1.1 - 2.5   Bilirubin Total 0.2 0.0 - 1.2 mg/dL   Alkaline Phosphatase 65 39 - 117 IU/L   AST 16 0 - 40 IU/L   ALT 12 0 - 32 IU/L  Lipid Panel w/o Chol/HDL Ratio  Result Value Ref Range   Cholesterol, Total 153 100 - 199 mg/dL   Triglycerides 161129 0 - 149 mg/dL   HDL 58 >09>39 mg/dL   VLDL Cholesterol Cal 26 5 - 40 mg/dL   LDL Calculated 69 0 - 99 mg/dL      Assessment & Plan:   Problem List Items Addressed This Visit      Unprioritized   Essential hypertension, benign - Primary    Stable,  continue present medications.            Follow up plan: Return in about 6 months (around 05/09/2016).

## 2016-01-08 ENCOUNTER — Other Ambulatory Visit: Payer: Self-pay | Admitting: Unknown Physician Specialty

## 2016-04-13 ENCOUNTER — Other Ambulatory Visit: Payer: Self-pay | Admitting: Family Medicine

## 2016-04-13 NOTE — Telephone Encounter (Signed)
Your patient 

## 2016-05-09 ENCOUNTER — Encounter: Payer: Self-pay | Admitting: Unknown Physician Specialty

## 2016-05-09 ENCOUNTER — Ambulatory Visit (INDEPENDENT_AMBULATORY_CARE_PROVIDER_SITE_OTHER): Payer: Medicare PPO | Admitting: Unknown Physician Specialty

## 2016-05-09 DIAGNOSIS — I10 Essential (primary) hypertension: Secondary | ICD-10-CM

## 2016-05-09 NOTE — Assessment & Plan Note (Signed)
Good BP for her age at home.  Will continue present medications

## 2016-05-09 NOTE — Progress Notes (Signed)
BP (!) 166/76 (BP Location: Left Arm, Cuff Size: Normal)   Pulse (!) 55   Temp 97.8 F (36.6 C)   Ht 4' 10.8" (1.494 m)   Wt 131 lb 6.4 oz (59.6 kg)   LMP  (LMP Unknown)   SpO2 96%   BMI 26.72 kg/m    Subjective:    Patient ID: Judith Hickman, female    DOB: [birth year], 82 y.o.   MRN: [id number]  HPI: Judith Hickman is a 82 y.o. female  Chief Complaint  Patient presents with  . Hypertension   Hypertension Using medications without difficulty Average home BPs 141/73   No problems or lightheadedness No chest pain with exertion or shortness of breath No Edema   Relevant past medical, surgical, family and social history reviewed and updated as indicated. Interim medical history since our last visit reviewed. Allergies and medications reviewed and updated.  Review of Systems  Per HPI unless specifically indicated above     Objective:    BP (!) 166/76 (BP Location: Left Arm, Cuff Size: Normal)   Pulse (!) 55   Temp 97.8 F (36.6 C)   Ht 4' 10.8" (1.494 m)   Wt 131 lb 6.4 oz (59.6 kg)   LMP  (LMP Unknown)   SpO2 96%   BMI 26.72 kg/m   Wt Readings from Last 3 Encounters:  05/09/16 131 lb 6.4 oz (59.6 kg)  11/08/15 134 lb 6.4 oz (61 kg)  07/27/15 134 lb 9.6 oz (61.1 kg)    Physical Exam  Constitutional: She is oriented to person, place, and time. She appears well-developed and well-nourished. No distress.  HENT:  Head: Normocephalic and atraumatic.  Eyes: Conjunctivae and lids are normal. Right eye exhibits no discharge. Left eye exhibits no discharge. No scleral icterus.  Neck: Normal range of motion. Neck supple. No JVD present. Carotid bruit is not present.  Cardiovascular: Normal rate, regular rhythm and normal heart sounds.   Pulmonary/Chest: Effort normal and breath sounds normal.  Abdominal: Normal appearance. There is no splenomegaly or hepatomegaly.  Musculoskeletal: Normal range of motion.  Neurological: She is alert and oriented to person,  place, and time.  Skin: Skin is warm, dry and intact. No rash noted. No pallor.  Psychiatric: She has a normal mood and affect. Her behavior is normal. Judgment and thought content normal.    Results for orders placed or performed in visit on 07/27/15  CBC with Differential/Platelet  Result Value Ref Range   WBC 5.9 3.4 - 10.8 x10E3/uL   RBC 4.55 3.77 - 5.28 x10E6/uL   Hemoglobin 13.0 11.1 - 15.9 g/dL   Hematocrit 40.939.7 81.134.0 - 46.6 %   MCV 87 79 - 97 fL   MCH 28.6 26.6 - 33.0 pg   MCHC 32.7 31.5 - 35.7 g/dL   RDW 91.414.3 78.212.3 - 95.615.4 %   Platelets 293 150 - 379 x10E3/uL   Neutrophils 60 %   Lymphs 32 %   Monocytes 5 %   Eos 2 %   Basos 1 %   Neutrophils Absolute 3.5 1.4 - 7.0 x10E3/uL   Lymphocytes Absolute 1.9 0.7 - 3.1 x10E3/uL   Monocytes Absolute 0.3 0.1 - 0.9 x10E3/uL   EOS (ABSOLUTE) 0.1 0.0 - 0.4 x10E3/uL   Basophils Absolute 0.0 0.0 - 0.2 x10E3/uL   Immature Granulocytes 0 %   Immature Grans (Abs) 0.0 0.0 - 0.1 x10E3/uL  Comprehensive metabolic panel  Result Value Ref Range   Glucose 98 65 - 99 mg/dL  BUN 15 8 - 27 mg/dL   Creatinine, Ser 0.450.70 0.57 - 1.00 mg/dL   GFR calc non Af Amer 83 >59 mL/min/1.73   GFR calc Af Amer 95 >59 mL/min/1.73   BUN/Creatinine Ratio 21 11 - 26   Sodium 141 134 - 144 mmol/L   Potassium 4.6 3.5 - 5.2 mmol/L   Chloride 99 96 - 106 mmol/L   CO2 26 18 - 29 mmol/L   Calcium 9.7 8.7 - 10.3 mg/dL   Total Protein 7.3 6.0 - 8.5 g/dL   Albumin 4.4 3.5 - 4.8 g/dL   Globulin, Total 2.9 1.5 - 4.5 g/dL   Albumin/Globulin Ratio 1.5 1.1 - 2.5   Bilirubin Total 0.2 0.0 - 1.2 mg/dL   Alkaline Phosphatase 65 39 - 117 IU/L   AST 16 0 - 40 IU/L   ALT 12 0 - 32 IU/L  Lipid Panel w/o Chol/HDL Ratio  Result Value Ref Range   Cholesterol, Total 153 100 - 199 mg/dL   Triglycerides 409129 0 - 149 mg/dL   HDL 58 >81>39 mg/dL   VLDL Cholesterol Cal 26 5 - 40 mg/dL   LDL Calculated 69 0 - 99 mg/dL      Assessment & Plan:   Problem List Items Addressed This  Visit      Unprioritized   Essential hypertension, benign    Good BP for her age at home.  Will continue present medications          Follow up plan: Return in about 3 months (around 08/07/2016) for 3 mponths for PE.

## 2016-07-15 ENCOUNTER — Other Ambulatory Visit: Payer: Self-pay | Admitting: Unknown Physician Specialty

## 2016-08-21 ENCOUNTER — Ambulatory Visit (INDEPENDENT_AMBULATORY_CARE_PROVIDER_SITE_OTHER): Payer: Medicare PPO | Admitting: Unknown Physician Specialty

## 2016-08-21 ENCOUNTER — Encounter: Payer: Self-pay | Admitting: Unknown Physician Specialty

## 2016-08-21 VITALS — BP 142/70 | HR 58 | Temp 98.6°F | Ht <= 58 in | Wt 133.8 lb

## 2016-08-21 DIAGNOSIS — M810 Age-related osteoporosis without current pathological fracture: Secondary | ICD-10-CM | POA: Diagnosis not present

## 2016-08-21 DIAGNOSIS — I10 Essential (primary) hypertension: Secondary | ICD-10-CM | POA: Diagnosis not present

## 2016-08-21 DIAGNOSIS — Z0000 Encounter for general adult medical examination without abnormal findings: Secondary | ICD-10-CM

## 2016-08-21 DIAGNOSIS — D509 Iron deficiency anemia, unspecified: Secondary | ICD-10-CM

## 2016-08-21 DIAGNOSIS — Z7189 Other specified counseling: Secondary | ICD-10-CM | POA: Insufficient documentation

## 2016-08-21 DIAGNOSIS — E782 Mixed hyperlipidemia: Secondary | ICD-10-CM | POA: Diagnosis not present

## 2016-08-21 DIAGNOSIS — N182 Chronic kidney disease, stage 2 (mild): Secondary | ICD-10-CM

## 2016-08-21 NOTE — Assessment & Plan Note (Signed)
Check CMP.  ?

## 2016-08-21 NOTE — Assessment & Plan Note (Signed)
Pt has a significant history.  Check labs today

## 2016-08-21 NOTE — Assessment & Plan Note (Signed)
Recheck of BP was 142/70.  Stable, continue present medications.

## 2016-08-21 NOTE — Assessment & Plan Note (Signed)
A voluntary discussion about advance care planning including the explanation and discussion of advance directives was extensively discussed  with the patient.  Explanation about the health care proxy and Living will was reviewed and packet with forms with explanation of how to fill them out was given.  During this discussion, the patient was able to identify a health care proxy as her granddaughter and will think about filling out the paperwork required.  Patient was offered a separate Advance Care Planning visit for further assistance with forms.

## 2016-08-21 NOTE — Assessment & Plan Note (Signed)
Continue Evista

## 2016-08-21 NOTE — Progress Notes (Signed)
BP (!) 142/70 (BP Location: Left Arm, Cuff Size: Normal)   Pulse (!) 58   Temp 98.6 F (37 C)   Ht 4' 9.8" (1.468 m)   Wt 133 lb 12.8 oz (60.7 kg)   LMP  (LMP Unknown)   SpO2 96%   BMI 28.16 kg/m    Subjective:    Patient ID: Judith Hickman, female    DOB: May 27, 1934, 83 y.o.   MRN: [id number]  HPI: Judith Hickman is a 83 y.o. female  Chief Complaint  Patient presents with  . Medicare Wellness   Functional Status Survey: Is the patient deaf or have difficulty hearing?: Yes (sometimes) Does the patient have difficulty seeing, even when wearing glasses/contacts?: No Does the patient have difficulty concentrating, remembering, or making decisions?: No Does the patient have difficulty walking or climbing stairs?: No Does the patient have difficulty dressing or bathing?: No Does the patient have difficulty doing errands alone such as visiting a doctor's office or shopping?: No  Fall Risk  08/21/2016 07/27/2015  Falls in the past year? No No   Depression screen [facility] 2/9 08/21/2016 07/27/2015 12/31/2014 11/19/2014  Decreased Interest 0 0 0 0  Down, Depressed, Hopeless 0 0 0 0  PHQ - 2 Score 0 0 0 0  Altered sleeping 0 - - -  Tired, decreased energy 0 - - -  Change in appetite 0 - - -  Feeling bad or failure about yourself  0 - - -  Trouble concentrating 0 - - -  Moving slowly or fidgety/restless 0 - - -  Suicidal thoughts 0 - - -  PHQ-9 Score 0 - - -   Mini cog is normal  Hypertension Using medications without difficulty Average home BPs SBP in the 140s   No problems or lightheadedness No chest pain with exertion or shortness of breath No Edema  Social History   Social History  . Marital status: Divorced    Spouse name: N/A  . Number of children: N/A  . Years of education: N/A   Occupational History  . Not on file.   Social History Main Topics  . Smoking status: Never Smoker  . Smokeless tobacco: Never Used  . Alcohol use No  . Drug use: No  . Sexual  activity: Not Currently   Other Topics Concern  . Not on file   Social History Narrative  . No narrative on file   Family History  Problem Relation Age of Onset  . Diabetes Mother   . Heart disease Father    Past Medical History:  Diagnosis Date  . Back pain   . Chronic kidney disease   . Costalchondritis   . Hyperlipidemia   . Hypertension   . Hypertensive CKD (chronic kidney disease)   . Iron deficiency anemia   . Mitral valve regurgitation   . Osteoporosis   . Rotator cuff syndrome of left shoulder    Past Surgical History:  Procedure Laterality Date  . ABDOMINAL HYSTERECTOMY    . BLADDER REPAIR     X 2  . CARPAL TUNNEL RELEASE      Relevant past medical, surgical, family and social history reviewed and updated as indicated. Interim medical history since our last visit reviewed. Allergies and medications reviewed and updated.  Review of Systems  Per HPI unless specifically indicated above     Objective:    BP (!) 142/70 (BP Location: Left Arm, Cuff Size: Normal)   Pulse (!) 58   Temp  98.6 F (37 C)   Ht 4' 9.8" (1.468 m)   Wt 133 lb 12.8 oz (60.7 kg)   LMP  (LMP Unknown)   SpO2 96%   BMI 28.16 kg/m   Wt Readings from Last 3 Encounters:  08/21/16 133 lb 12.8 oz (60.7 kg)  05/09/16 131 lb 6.4 oz (59.6 kg)  11/08/15 134 lb 6.4 oz (61 kg)    Physical Exam  Constitutional: She is oriented to person, place, and time. She appears well-developed and well-nourished. No distress.  HENT:  Head: Normocephalic and atraumatic.  Eyes: Conjunctivae and lids are normal. Right eye exhibits no discharge. Left eye exhibits no discharge. No scleral icterus.  Neck: Normal range of motion. Neck supple. No JVD present. Carotid bruit is not present.  Cardiovascular: Normal rate, regular rhythm and normal heart sounds.   Pulmonary/Chest: Effort normal and breath sounds normal.  Abdominal: Normal appearance. There is no splenomegaly or hepatomegaly.  Musculoskeletal:  Normal range of motion.  Neurological: She is alert and oriented to person, place, and time.  Skin: Skin is warm, dry and intact. No rash noted. No pallor.  Psychiatric: She has a normal mood and affect. Her behavior is normal. Judgment and thought content normal.    Results for orders placed or performed in visit on 07/27/15  CBC with Differential/Platelet  Result Value Ref Range   WBC 5.9 3.4 - 10.8 x10E3/uL   RBC 4.55 3.77 - 5.28 x10E6/uL   Hemoglobin 13.0 11.1 - 15.9 g/dL   Hematocrit 78.239.7 95.634.0 - 46.6 %   MCV 87 79 - 97 fL   MCH 28.6 26.6 - 33.0 pg   MCHC 32.7 31.5 - 35.7 g/dL   RDW 21.314.3 08.612.3 - 57.815.4 %   Platelets 293 150 - 379 x10E3/uL   Neutrophils 60 %   Lymphs 32 %   Monocytes 5 %   Eos 2 %   Basos 1 %   Neutrophils Absolute 3.5 1.4 - 7.0 x10E3/uL   Lymphocytes Absolute 1.9 0.7 - 3.1 x10E3/uL   Monocytes Absolute 0.3 0.1 - 0.9 x10E3/uL   EOS (ABSOLUTE) 0.1 0.0 - 0.4 x10E3/uL   Basophils Absolute 0.0 0.0 - 0.2 x10E3/uL   Immature Granulocytes 0 %   Immature Grans (Abs) 0.0 0.0 - 0.1 x10E3/uL  Comprehensive metabolic panel  Result Value Ref Range   Glucose 98 65 - 99 mg/dL   BUN 15 8 - 27 mg/dL   Creatinine, Ser 4.690.70 0.57 - 1.00 mg/dL   GFR calc non Af Amer 83 >59 mL/min/1.73   GFR calc Af Amer 95 >59 mL/min/1.73   BUN/Creatinine Ratio 21 11 - 26   Sodium 141 134 - 144 mmol/L   Potassium 4.6 3.5 - 5.2 mmol/L   Chloride 99 96 - 106 mmol/L   CO2 26 18 - 29 mmol/L   Calcium 9.7 8.7 - 10.3 mg/dL   Total Protein 7.3 6.0 - 8.5 g/dL   Albumin 4.4 3.5 - 4.8 g/dL   Globulin, Total 2.9 1.5 - 4.5 g/dL   Albumin/Globulin Ratio 1.5 1.1 - 2.5   Bilirubin Total 0.2 0.0 - 1.2 mg/dL   Alkaline Phosphatase 65 39 - 117 IU/L   AST 16 0 - 40 IU/L   ALT 12 0 - 32 IU/L  Lipid Panel w/o Chol/HDL Ratio  Result Value Ref Range   Cholesterol, Total 153 100 - 199 mg/dL   Triglycerides 629129 0 - 149 mg/dL   HDL 58 >52>39 mg/dL   VLDL Cholesterol Cal 26  5 - 40 mg/dL   LDL Calculated 69 0 -  99 mg/dL      Assessment & Plan:   Problem List Items Addressed This Visit      Unprioritized   Advanced care planning/counseling discussion    A voluntary discussion about advance care planning including the explanation and discussion of advance directives was extensively discussed  with the patient.  Explanation about the health care proxy and Living will was reviewed and packet with forms with explanation of how to fill them out was given.  During this discussion, the patient was able to identify a health care proxy as her granddaughter and will think about filling out the paperwork required.  Patient was offered a separate Advance Care Planning visit for further assistance with forms.         CKD (chronic kidney disease), stage II    Check CMP      Essential hypertension, benign    Recheck of BP was 142/70.  Stable, continue present medications.        Hyperlipidemia    Cholesterol high last visit but refusing statins.  Will recheck      Osteoporosis    Continue Evista          Follow up plan: No Follow-up on file.

## 2016-08-21 NOTE — Assessment & Plan Note (Signed)
Cholesterol high last visit but refusing statins.  Will recheck

## 2016-08-22 ENCOUNTER — Encounter: Payer: Self-pay | Admitting: Unknown Physician Specialty

## 2016-08-22 LAB — CBC WITH DIFFERENTIAL/PLATELET
Basophils Absolute: 0 10*3/uL (ref 0.0–0.2)
Basos: 1 %
EOS (ABSOLUTE): 0.1 10*3/uL (ref 0.0–0.4)
Eos: 2 %
Hematocrit: 37.9 % (ref 34.0–46.6)
Hemoglobin: 12.8 g/dL (ref 11.1–15.9)
IMMATURE GRANULOCYTES: 0 %
Immature Grans (Abs): 0 10*3/uL (ref 0.0–0.1)
Lymphocytes Absolute: 1.6 10*3/uL (ref 0.7–3.1)
Lymphs: 29 %
MCH: 29.6 pg (ref 26.6–33.0)
MCHC: 33.8 g/dL (ref 31.5–35.7)
MCV: 88 fL (ref 79–97)
MONOS ABS: 0.2 10*3/uL (ref 0.1–0.9)
Monocytes: 4 %
NEUTROS PCT: 64 %
Neutrophils Absolute: 3.5 10*3/uL (ref 1.4–7.0)
PLATELETS: 374 10*3/uL (ref 150–379)
RBC: 4.33 x10E6/uL (ref 3.77–5.28)
RDW: 13.9 % (ref 12.3–15.4)
WBC: 5.5 10*3/uL (ref 3.4–10.8)

## 2016-08-22 LAB — COMPREHENSIVE METABOLIC PANEL
A/G RATIO: 1.7 (ref 1.2–2.2)
ALT: 14 IU/L (ref 0–32)
AST: 17 IU/L (ref 0–40)
Albumin: 4.3 g/dL (ref 3.5–4.7)
Alkaline Phosphatase: 57 IU/L (ref 39–117)
BUN/Creatinine Ratio: 22 (ref 12–28)
BUN: 15 mg/dL (ref 8–27)
Bilirubin Total: <0.2 mg/dL (ref 0.0–1.2)
CALCIUM: 9.6 mg/dL (ref 8.7–10.3)
CO2: 27 mmol/L (ref 18–29)
Chloride: 100 mmol/L (ref 96–106)
Creatinine, Ser: 0.68 mg/dL (ref 0.57–1.00)
GFR, EST AFRICAN AMERICAN: 96 mL/min/{1.73_m2} (ref >59)
GFR, EST NON AFRICAN AMERICAN: 83 mL/min/{1.73_m2} (ref >59)
Globulin, Total: 2.6 g/dL (ref 1.5–4.5)
Glucose: 106 mg/dL — ABNORMAL HIGH (ref 65–99)
Potassium: 4.2 mmol/L (ref 3.5–5.2)
Sodium: 143 mmol/L (ref 134–144)
TOTAL PROTEIN: 6.9 g/dL (ref 6.0–8.5)

## 2016-08-22 LAB — LIPID PANEL W/O CHOL/HDL RATIO
Cholesterol, Total: 226 mg/dL — ABNORMAL HIGH (ref 100–199)
HDL: 63 mg/dL (ref >39)
LDL Calculated: 127 mg/dL — ABNORMAL HIGH (ref 0–99)
Triglycerides: 178 mg/dL — ABNORMAL HIGH (ref 0–149)
VLDL CHOLESTEROL CAL: 36 mg/dL (ref 5–40)

## 2016-10-17 ENCOUNTER — Other Ambulatory Visit: Payer: Self-pay | Admitting: Unknown Physician Specialty

## 2017-01-06 ENCOUNTER — Other Ambulatory Visit: Payer: Self-pay | Admitting: Unknown Physician Specialty

## 2017-01-16 ENCOUNTER — Other Ambulatory Visit: Payer: Self-pay | Admitting: Unknown Physician Specialty

## 2017-02-20 ENCOUNTER — Ambulatory Visit: Payer: Medicare PPO | Admitting: Unknown Physician Specialty

## 2017-02-27 ENCOUNTER — Encounter: Payer: Self-pay | Admitting: Unknown Physician Specialty

## 2017-02-27 ENCOUNTER — Ambulatory Visit (INDEPENDENT_AMBULATORY_CARE_PROVIDER_SITE_OTHER): Payer: Medicare PPO | Admitting: Unknown Physician Specialty

## 2017-02-27 VITALS — BP 149/72 | HR 55 | Temp 98.3°F | Wt 131.8 lb

## 2017-02-27 DIAGNOSIS — I10 Essential (primary) hypertension: Secondary | ICD-10-CM

## 2017-02-27 DIAGNOSIS — Z23 Encounter for immunization: Secondary | ICD-10-CM | POA: Diagnosis not present

## 2017-02-27 NOTE — Progress Notes (Signed)
BP (!) 149/72 (BP Location: Left Arm, Cuff Size: Normal)   Pulse (!) 55   Temp 98.3 F (36.8 C)   Wt 131 lb 12.8 oz (59.8 kg)   LMP  (LMP Unknown)   SpO2 97%   BMI 27.74 kg/m    Subjective:    Patient ID: Judith Hickman, female    DOB: 1933-09-02, 83 y.o.   MRN: [id number]  HPI: Judith Hickman is a 83 y.o. female  Chief Complaint  Patient presents with  . Hyperlipidemia  . Hypertension   Hypertension Using medications without difficulty Average home BPs about what they are here  No problems or lightheadedness No chest pain with exertion or shortness of breath No Edema    Relevant past medical, surgical, family and social history reviewed and updated as indicated. Interim medical history since our last visit reviewed. Allergies and medications reviewed and updated.  Review of Systems  Per HPI unless specifically indicated above     Objective:    BP (!) 149/72 (BP Location: Left Arm, Cuff Size: Normal)   Pulse (!) 55   Temp 98.3 F (36.8 C)   Wt 131 lb 12.8 oz (59.8 kg)   LMP  (LMP Unknown)   SpO2 97%   BMI 27.74 kg/m   Wt Readings from Last 3 Encounters:  02/27/17 131 lb 12.8 oz (59.8 kg)  08/21/16 133 lb 12.8 oz (60.7 kg)  05/09/16 131 lb 6.4 oz (59.6 kg)    Physical Exam  Constitutional: She is oriented to person, place, and time. She appears well-developed and well-nourished. No distress.  HENT:  Head: Normocephalic and atraumatic.  Eyes: Conjunctivae and lids are normal. Right eye exhibits no discharge. Left eye exhibits no discharge. No scleral icterus.  Neck: Normal range of motion. Neck supple. No JVD present. Carotid bruit is not present.  Cardiovascular: Normal rate, regular rhythm and normal heart sounds.   Pulmonary/Chest: Effort normal and breath sounds normal.  Abdominal: Normal appearance. There is no splenomegaly or hepatomegaly.  Musculoskeletal: Normal range of motion.  Neurological: She is alert and oriented to person, place, and  time.  Skin: Skin is warm, dry and intact. No rash noted. No pallor.  Psychiatric: She has a normal mood and affect. Her behavior is normal. Judgment and thought content normal.    Results for orders placed or performed in visit on 08/21/16  Comprehensive metabolic panel  Result Value Ref Range   Glucose 106 (H) 65 - 99 mg/dL   BUN 15 8 - 27 mg/dL   Creatinine, Ser 4.09 0.57 - 1.00 mg/dL   GFR calc non Af Amer 83 >59 mL/min/1.73   GFR calc Af Amer 96 >59 mL/min/1.73   BUN/Creatinine Ratio 22 12 - 28   Sodium 143 134 - 144 mmol/L   Potassium 4.2 3.5 - 5.2 mmol/L   Chloride 100 96 - 106 mmol/L   CO2 27 18 - 29 mmol/L   Calcium 9.6 8.7 - 10.3 mg/dL   Total Protein 6.9 6.0 - 8.5 g/dL   Albumin 4.3 3.5 - 4.7 g/dL   Globulin, Total 2.6 1.5 - 4.5 g/dL   Albumin/Globulin Ratio 1.7 1.2 - 2.2   Bilirubin Total <0.2 0.0 - 1.2 mg/dL   Alkaline Phosphatase 57 39 - 117 IU/L   AST 17 0 - 40 IU/L   ALT 14 0 - 32 IU/L  Lipid Panel w/o Chol/HDL Ratio  Result Value Ref Range   Cholesterol, Total 226 (H) 100 - 199 mg/dL  Triglycerides 178 (H) 0 - 149 mg/dL   HDL 63 >19 mg/dL   VLDL Cholesterol Cal 36 5 - 40 mg/dL   LDL Calculated 147 (H) 0 - 99 mg/dL  CBC with Differential/Platelet  Result Value Ref Range   WBC 5.5 3.4 - 10.8 x10E3/uL   RBC 4.33 3.77 - 5.28 x10E6/uL   Hemoglobin 12.8 11.1 - 15.9 g/dL   Hematocrit 82.9 56.2 - 46.6 %   MCV 88 79 - 97 fL   MCH 29.6 26.6 - 33.0 pg   MCHC 33.8 31.5 - 35.7 g/dL   RDW 13.0 86.5 - 78.4 %   Platelets 374 150 - 379 x10E3/uL   Neutrophils 64 Not Estab. %   Lymphs 29 Not Estab. %   Monocytes 4 Not Estab. %   Eos 2 Not Estab. %   Basos 1 Not Estab. %   Neutrophils Absolute 3.5 1.4 - 7.0 x10E3/uL   Lymphocytes Absolute 1.6 0.7 - 3.1 x10E3/uL   Monocytes Absolute 0.2 0.1 - 0.9 x10E3/uL   EOS (ABSOLUTE) 0.1 0.0 - 0.4 x10E3/uL   Basophils Absolute 0.0 0.0 - 0.2 x10E3/uL   Immature Granulocytes 0 Not Estab. %   Immature Grans (Abs) 0.0 0.0 - 0.1  x10E3/uL      Assessment & Plan:   Problem List Items Addressed This Visit      Unprioritized   Essential hypertension, benign    Not to goal but multiple regimens have been tried and currently treatment program is tolerated. Stable.  Continue present medications.         Other Visit Diagnoses    Need for influenza vaccination    -  Primary   Relevant Orders   Flu vaccine HIGH DOSE PF (Completed)       Follow up plan: Return in about 6 months (around 08/27/2017) for physical.

## 2017-02-27 NOTE — Assessment & Plan Note (Addendum)
Not to goal but multiple regimens have been tried and currently treatment program is tolerated. Stable.  Continue present medications.

## 2017-02-27 NOTE — Patient Instructions (Addendum)

## 2017-04-23 ENCOUNTER — Other Ambulatory Visit: Payer: Self-pay | Admitting: Unknown Physician Specialty

## 2017-04-23 NOTE — Telephone Encounter (Signed)
Evista refill; will skype [phone]

## 2017-07-24 ENCOUNTER — Telehealth: Payer: Self-pay

## 2017-07-24 ENCOUNTER — Other Ambulatory Visit: Payer: Self-pay | Admitting: Unknown Physician Specialty

## 2017-07-24 NOTE — Telephone Encounter (Signed)
Copied from CRM [phone]. Topic: Medicare AWV >> Jul 24, 2017  1:52 PM [place], [place] A, LPN wrote: Reason for UEA:[id number]:[facility] medicare [facility] wellness [facility] Can be scheduled anytime after 08/22/2017.  If scheduled please put in appt note: last awv 08/21/2017.

## 2017-08-28 ENCOUNTER — Ambulatory Visit: Payer: Medicare PPO

## 2017-09-10 ENCOUNTER — Encounter: Payer: Medicare PPO | Admitting: Unknown Physician Specialty

## 2017-09-18 ENCOUNTER — Ambulatory Visit (INDEPENDENT_AMBULATORY_CARE_PROVIDER_SITE_OTHER): Payer: Medicare PPO | Admitting: Unknown Physician Specialty

## 2017-09-18 ENCOUNTER — Ambulatory Visit (INDEPENDENT_AMBULATORY_CARE_PROVIDER_SITE_OTHER): Payer: Medicare PPO

## 2017-09-18 ENCOUNTER — Encounter: Payer: Self-pay | Admitting: Unknown Physician Specialty

## 2017-09-18 VITALS — BP 112/68 | HR 62 | Temp 97.8°F | Ht <= 58 in | Wt 132.5 lb

## 2017-09-18 VITALS — BP 112/68 | HR 62 | Temp 97.8°F | Resp 16 | Ht <= 58 in | Wt 132.5 lb

## 2017-09-18 DIAGNOSIS — M810 Age-related osteoporosis without current pathological fracture: Secondary | ICD-10-CM

## 2017-09-18 DIAGNOSIS — I10 Essential (primary) hypertension: Secondary | ICD-10-CM | POA: Diagnosis not present

## 2017-09-18 DIAGNOSIS — Z7189 Other specified counseling: Secondary | ICD-10-CM

## 2017-09-18 DIAGNOSIS — Z0001 Encounter for general adult medical examination with abnormal findings: Secondary | ICD-10-CM | POA: Diagnosis not present

## 2017-09-18 DIAGNOSIS — D509 Iron deficiency anemia, unspecified: Secondary | ICD-10-CM

## 2017-09-18 DIAGNOSIS — Z0000 Encounter for general adult medical examination without abnormal findings: Secondary | ICD-10-CM

## 2017-09-18 NOTE — Patient Instructions (Addendum)
Judith Hickman , Thank you for taking time to come for your Medicare Wellness Visit. I appreciate your ongoing commitment to your health goals. Please review the following plan we discussed and let me know if I can assist you in the future.   Screening recommendations/referrals: Colonoscopy: no longer required Mammogram: no longer required Bone Density: completed 09/07/2009 Recommended yearly ophthalmology/optometry visit for glaucoma screening and checkup Recommended yearly dental visit for hygiene and checkup  Vaccinations: Influenza vaccine: up to date Pneumococcal vaccine: up to date Tdap vaccine: up to date Shingles vaccine: eligible, check with your insurance company for coverage information.     Advanced directives: copy on file  Conditions/risks identified: Recommend drinking at least 6-8 glasses of water a day   Next appointment: Follow up in one year for your annual wellness exam.    Preventive Care 65 Years and Older, Female Preventive care refers to lifestyle choices and visits with your health care provider that can promote health and wellness. What does preventive care include?  A yearly physical exam. This is also called an annual well check.  Dental exams once or twice a year.  Routine eye exams. Ask your health care provider how often you should have your eyes checked.  Personal lifestyle choices, including:  Daily care of your teeth and gums.  Regular physical activity.  Eating a healthy diet.  Avoiding tobacco and drug use.  Limiting alcohol use.  Practicing safe sex.  Taking low-dose aspirin every day.  Taking vitamin and mineral supplements as recommended by your health care provider. What happens during an annual well check? The services and screenings done by your health care provider during your annual well check will depend on your age, overall health, lifestyle risk factors, and family history of disease. Counseling  Your health care provider  may ask you questions about your:  Alcohol use.  Tobacco use.  Drug use.  Emotional well-being.  Home and relationship well-being.  Sexual activity.  Eating habits.  History of falls.  Memory and ability to understand (cognition).  Work and work Astronomerenvironment.  Reproductive health. Screening  You may have the following tests or measurements:  Height, weight, and BMI.  Blood pressure.  Lipid and cholesterol levels. These may be checked every 5 years, or more frequently if you are over 82 years old.  Skin check.  Lung cancer screening. You may have this screening every year starting at age 82 if you have a 30-pack-year history of smoking and currently smoke or have quit within the past 15 years.  Fecal occult blood test (FOBT) of the stool. You may have this test every year starting at age 82.  Flexible sigmoidoscopy or colonoscopy. You may have a sigmoidoscopy every 5 years or a colonoscopy every 10 years starting at age 82.  Hepatitis C blood test.  Hepatitis B blood test.  Sexually transmitted disease (STD) testing.  Diabetes screening. This is done by checking your blood sugar (glucose) after you have not eaten for a while (fasting). You may have this done every 1-3 years.  Bone density scan. This is done to screen for osteoporosis. You may have this done starting at age 82.  Mammogram. This may be done every 1-2 years. Talk to your health care provider about how often you should have regular mammograms. Talk with your health care provider about your test results, treatment options, and if necessary, the need for more tests. Vaccines  Your health care provider may recommend certain vaccines, such as:  Influenza vaccine. This is recommended every year.  Tetanus, diphtheria, and acellular pertussis (Tdap, Td) vaccine. You may need a Td booster every 10 years.  Zoster vaccine. You may need this after age 82.  Pneumococcal 13-valent conjugate (PCV13) vaccine.  One dose is recommended after age 82.  Pneumococcal polysaccharide (PPSV23) vaccine. One dose is recommended after age 82. Talk to your health care provider about which screenings and vaccines you need and how often you need them. This information is not intended to replace advice given to you by your health care provider. Make sure you discuss any questions you have with your health care provider. Document Released: 06/17/2015 Document Revised: 02/08/2016 Document Reviewed: 03/22/2015 Elsevier Interactive Patient Education  2017 [employer] Prevention in the Home Falls can cause injuries. They can happen to people of all ages. There are many things you can do to make your home safe and to help prevent falls. What can I do on the outside of my home?  Regularly fix the edges of walkways and driveways and fix any cracks.  Remove anything that might make you trip as you walk through a door, such as a raised step or threshold.  Trim any bushes or trees on the path to your home.  Use bright outdoor lighting.  Clear any walking paths of anything that might make someone trip, such as rocks or tools.  Regularly check to see if handrails are loose or broken. Make sure that both sides of any steps have handrails.  Any raised decks and porches should have guardrails on the edges.  Have any leaves, snow, or ice cleared regularly.  Use sand or salt on walking paths during winter.  Clean up any spills in your garage right away. This includes oil or grease spills. What can I do in the bathroom?  Use night lights.  Install grab bars by the toilet and in the tub and shower. Do not use towel bars as grab bars.  Use non-skid mats or decals in the tub or shower.  If you need to sit down in the shower, use a plastic, non-slip stool.  Keep the floor dry. Clean up any water that spills on the floor as soon as it happens.  Remove soap buildup in the tub or shower regularly.  Attach bath  mats securely with double-sided non-slip rug tape.  Do not have throw rugs and other things on the floor that can make you trip. What can I do in the bedroom?  Use night lights.  Make sure that you have a light by your bed that is easy to reach.  Do not use any sheets or blankets that are too big for your bed. They should not hang down onto the floor.  Have a firm chair that has side arms. You can use this for support while you get dressed.  Do not have throw rugs and other things on the floor that can make you trip. What can I do in the kitchen?  Clean up any spills right away.  Avoid walking on wet floors.  Keep items that you use a lot in easy-to-reach places.  If you need to reach something above you, use a strong step stool that has a grab bar.  Keep electrical cords out of the way.  Do not use floor polish or wax that makes floors slippery. If you must use wax, use non-skid floor wax.  Do not have throw rugs and other things on the floor that  can make you trip. What can I do with my stairs?  Do not leave any items on the stairs.  Make sure that there are handrails on both sides of the stairs and use them. Fix handrails that are broken or loose. Make sure that handrails are as long as the stairways.  Check any carpeting to make sure that it is firmly attached to the stairs. Fix any carpet that is loose or worn.  Avoid having throw rugs at the top or bottom of the stairs. If you do have throw rugs, attach them to the floor with carpet tape.  Make sure that you have a light switch at the top of the stairs and the bottom of the stairs. If you do not have them, ask someone to add them for you. What else can I do to help prevent falls?  Wear shoes that:  Do not have high heels.  Have rubber bottoms.  Are comfortable and fit you well.  Are closed at the toe. Do not wear sandals.  If you use a stepladder:  Make sure that it is fully opened. Do not climb a closed  stepladder.  Make sure that both sides of the stepladder are locked into place.  Ask someone to hold it for you, if possible.  Clearly mark and make sure that you can see:  Any grab bars or handrails.  First and last steps.  Where the edge of each step is.  Use tools that help you move around (mobility aids) if they are needed. These include:  Canes.  Walkers.  Scooters.  Crutches.  Turn on the lights when you go into a dark area. Replace any light bulbs as soon as they burn out.  Set up your furniture so you have a clear path. Avoid moving your furniture around.  If any of your floors are uneven, fix them.  If there are any pets around you, be aware of where they are.  Review your medicines with your doctor. Some medicines can make you feel dizzy. This can increase your chance of falling. Ask your doctor what other things that you can do to help prevent falls. This information is not intended to replace advice given to you by your health care provider. Make sure you discuss any questions you have with your health care provider. Document Released: 03/17/2009 Document Revised: 10/27/2015 Document Reviewed: 06/25/2014 Elsevier Interactive Patient Education  2017 [employer].

## 2017-09-18 NOTE — Progress Notes (Signed)
BP 112/68   Pulse 62   Temp 97.8 F (36.6 C) (Temporal)   Ht 4\' 10"  (1.473 m)   Wt 132 lb 8 oz (60.1 kg)   LMP  (LMP Unknown)   SpO2 96%   BMI 27.69 kg/m    Subjective:    Patient ID: Judith Hickman, female    DOB: Nov 27, 1933, 84 y.o.   MRN: [id number]  HPI: Judith HummerMartha A Youngers is a 84 y.o. female  Chief Complaint  Patient presents with  . Annual Exam    pt had wellness visit before this visit   Hypertension Using medications without difficulty Average home BPs   No problems or lightheadedness No chest pain with exertion or shortness of breath No Edema  Relevant past medical, surgical, family and social history reviewed and updated as indicated. Interim medical history since our last visit reviewed. Allergies and medications reviewed and updated.  Review of Systems  Constitutional: Negative.   HENT: Negative.   Eyes: Negative.   Respiratory: Negative.   Cardiovascular: Negative.   Gastrointestinal: Negative.   Endocrine: Negative.   Genitourinary: Negative.   Musculoskeletal: Negative.   Skin: Negative.   Allergic/Immunologic: Negative.   Neurological: Negative.   Hematological: Negative.   Psychiatric/Behavioral: Negative.     Per HPI unless specifically indicated above     Objective:    BP 112/68   Pulse 62   Temp 97.8 F (36.6 C) (Temporal)   Ht 4\' 10"  (1.473 m)   Wt 132 lb 8 oz (60.1 kg)   LMP  (LMP Unknown)   SpO2 96%   BMI 27.69 kg/m   Wt Readings from Last 3 Encounters:  09/18/17 132 lb 8 oz (60.1 kg)  09/18/17 132 lb 8 oz (60.1 kg)  02/27/17 131 lb 12.8 oz (59.8 kg)    Physical Exam  Constitutional: She is oriented to person, place, and time. She appears well-developed and well-nourished.  HENT:  Head: Normocephalic and atraumatic.  Eyes: Pupils are equal, round, and reactive to light. Right eye exhibits no discharge. Left eye exhibits no discharge. No scleral icterus.  Neck: Normal range of motion. Neck supple. Carotid bruit is not  present. No thyromegaly present.  Cardiovascular: Normal rate, regular rhythm and normal heart sounds. Exam reveals no gallop and no friction rub.  No murmur heard. Pulmonary/Chest: Effort normal and breath sounds normal. No respiratory distress. She has no wheezes. She has no rales. No breast tenderness or discharge.  Abdominal: Soft. Bowel sounds are normal. There is no tenderness. There is no rebound.  Genitourinary: No breast tenderness or discharge.  Musculoskeletal: Normal range of motion.  Lymphadenopathy:    She has no cervical adenopathy.  Neurological: She is alert and oriented to person, place, and time.  Skin: Skin is warm, dry and intact. No rash noted.  Psychiatric: She has a normal mood and affect. Her speech is normal and behavior is normal. Judgment and thought content normal. Cognition and memory are normal.    Results for orders placed or performed in visit on 08/21/16  Comprehensive metabolic panel  Result Value Ref Range   Glucose 106 (H) 65 - 99 mg/dL   BUN 15 8 - 27 mg/dL   Creatinine, Ser 4.090.68 0.57 - 1.00 mg/dL   GFR calc non Af Amer 83 >59 mL/min/1.73   GFR calc Af Amer 96 >59 mL/min/1.73   BUN/Creatinine Ratio 22 12 - 28   Sodium 143 134 - 144 mmol/L   Potassium 4.2 3.5 - 5.2  mmol/L   Chloride 100 96 - 106 mmol/L   CO2 27 18 - 29 mmol/L   Calcium 9.6 8.7 - 10.3 mg/dL   Total Protein 6.9 6.0 - 8.5 g/dL   Albumin 4.3 3.5 - 4.7 g/dL   Globulin, Total 2.6 1.5 - 4.5 g/dL   Albumin/Globulin Ratio 1.7 1.2 - 2.2   Bilirubin Total <0.2 0.0 - 1.2 mg/dL   Alkaline Phosphatase 57 39 - 117 IU/L   AST 17 0 - 40 IU/L   ALT 14 0 - 32 IU/L  Lipid Panel w/o Chol/HDL Ratio  Result Value Ref Range   Cholesterol, Total 226 (H) 100 - 199 mg/dL   Triglycerides 696 (H) 0 - 149 mg/dL   HDL 63 >29 mg/dL   VLDL Cholesterol Cal 36 5 - 40 mg/dL   LDL Calculated 528 (H) 0 - 99 mg/dL  CBC with Differential/Platelet  Result Value Ref Range   WBC 5.5 3.4 - 10.8 x10E3/uL   RBC  4.33 3.77 - 5.28 x10E6/uL   Hemoglobin 12.8 11.1 - 15.9 g/dL   Hematocrit 41.3 24.4 - 46.6 %   MCV 88 79 - 97 fL   MCH 29.6 26.6 - 33.0 pg   MCHC 33.8 31.5 - 35.7 g/dL   RDW 01.0 27.2 - 53.6 %   Platelets 374 150 - 379 x10E3/uL   Neutrophils 64 Not Estab. %   Lymphs 29 Not Estab. %   Monocytes 4 Not Estab. %   Eos 2 Not Estab. %   Basos 1 Not Estab. %   Neutrophils Absolute 3.5 1.4 - 7.0 x10E3/uL   Lymphocytes Absolute 1.6 0.7 - 3.1 x10E3/uL   Monocytes Absolute 0.2 0.1 - 0.9 x10E3/uL   EOS (ABSOLUTE) 0.1 0.0 - 0.4 x10E3/uL   Basophils Absolute 0.0 0.0 - 0.2 x10E3/uL   Immature Granulocytes 0 Not Estab. %   Immature Grans (Abs) 0.0 0.0 - 0.1 x10E3/uL      Assessment & Plan:   Problem List Items Addressed This Visit      Unprioritized   Advanced care planning/counseling discussion   Anemia    History of significant anemia.  Recheck today      Relevant Orders   CBC with Differential/Platelet   Essential hypertension, benign - Primary    Stable, continue present medications.        Relevant Orders   Lipid Panel w/o Chol/HDL Ratio   Comprehensive metabolic panel   Osteoporosis    Taking Evista.  Schedule another bone density      Relevant Orders   DG Bone Density    Other Visit Diagnoses    Annual physical exam          HM Refuxing Shingrx Td due but no injury or cuts reported   Follow up plan: Return in about 6 months (around 03/20/2018).

## 2017-09-18 NOTE — Patient Instructions (Signed)
Please do call to schedule your bone density; the number to schedule one at either [facility] or [facility] is [phone]   

## 2017-09-18 NOTE — Assessment & Plan Note (Signed)
Stable, continue present medications.   

## 2017-09-18 NOTE — Assessment & Plan Note (Signed)
History of significant anemia.  Recheck today

## 2017-09-18 NOTE — Assessment & Plan Note (Signed)
Taking Evista.  Schedule another bone density

## 2017-09-18 NOTE — Progress Notes (Signed)
Subjective:   Judith Hickman is a 84 y.o. female who presents for Medicare Annual (Subsequent) preventive examination.  Review of Systems:   Cardiac Risk Factors include: advanced age (>1555men, 48>65 women);hypertension;dyslipidemia     Objective:     Vitals: BP 112/68 (BP Location: Left Arm, Patient Position: Sitting)   Pulse 62   Temp 97.8 F (36.6 C) (Temporal)   Resp 16   Ht 4\' 10"  (1.473 m)   Wt 132 lb 8 oz (60.1 kg)   LMP  (LMP Unknown)   SpO2 96%   BMI 27.69 kg/m   Body mass index is 27.69 kg/m.  Advanced Directives 09/18/2017 08/21/2016 07/27/2015 07/27/2015  Does Patient Have a Medical Advance Directive? Yes No No No  Type of Advance Directive Healthcare Power of Attorney - - -  Copy of Healthcare Power of Attorney in Chart? Yes - - -  Would patient like information on creating a medical advance directive? - No - Patient declined Yes - Educational materials given -    Tobacco Social History   Tobacco Use  Smoking Status Never Smoker  Smokeless Tobacco Never Used     Counseling given: Not Answered   Clinical Intake:  Pre-visit preparation completed: Yes  Pain : No/denies pain     Nutritional Status: BMI 25 -29 Overweight Nutritional Risks: None Diabetes: No  How often do you need to have someone help you when you read instructions, pamphlets, or other written materials from your doctor or pharmacy?: 1 - Never What is the last grade level you completed in school?: 8th grade  Interpreter Needed?: No  Information entered by :: Tykeria Wawrzyniak,LPN   Past Medical History:  Diagnosis Date  . Back pain   . Chronic kidney disease   . Costalchondritis   . Hyperlipidemia   . Hypertension   . Hypertensive CKD (chronic kidney disease)   . Iron deficiency anemia   . Mitral valve regurgitation   . Osteoporosis   . Rotator cuff syndrome of left shoulder    Past Surgical History:  Procedure Laterality Date  . ABDOMINAL HYSTERECTOMY    . BLADDER REPAIR       X 2  . CARPAL TUNNEL RELEASE     Family History  Problem Relation Age of Onset  . Diabetes Mother   . Heart disease Father    Social History   Socioeconomic History  . Marital status: Divorced    Spouse name: Not on file  . Number of children: Not on file  . Years of education: Not on file  . Highest education level: Not on file  Occupational History  . Not on file  Social Needs  . Financial resource strain: Not hard at all  . Food insecurity:    Worry: Never true    Inability: Never true  . Transportation needs:    Medical: No    Non-medical: No  Tobacco Use  . Smoking status: Never Smoker  . Smokeless tobacco: Never Used  Substance and Sexual Activity  . Alcohol use: No    Alcohol/week: 0.0 oz  . Drug use: No  . Sexual activity: Not Currently  Lifestyle  . Physical activity:    Days per week: 0 days    Minutes per session: 0 min  . Stress: Not at all  Relationships  . Social connections:    Talks on phone: More than three times a week    Gets together: Once a week    Attends religious service: More  than 4 times per year    Active member of club or organization: No    Attends meetings of clubs or organizations: Never    Relationship status: Divorced  Other Topics Concern  . Not on file  Social History Narrative  . Not on file    Outpatient Encounter Medications as of 09/18/2017  Medication Sig  . amLODipine (NORVASC) 10 MG tablet TAKE ONE TABLET BY MOUTH ONCE DAILY  . hydrochlorothiazide (MICROZIDE) 12.5 MG capsule TAKE ONE CAPSULE BY MOUTH ONCE DAILY *STOP  TRIAMTERENE/HCTZ*  . IRON PO Take by mouth.   . metoprolol succinate (TOPROL-XL) 50 MG 24 hr tablet TAKE ONE TABLET BY MOUTH ONCE DAILY TAKE  WITH  OR  IMMEDIATELY  FOLLOWING  A  MEAL  . raloxifene (EVISTA) 60 MG tablet TAKE 1 TABLET BY MOUTH ONCE DAILY  . VITAMIN D, CHOLECALCIFEROL, PO Take by mouth.    No facility-administered encounter medications on file as of 09/18/2017.     Activities of  Daily Living In your present state of health, do you have any difficulty performing the following activities: 09/18/2017  Hearing? N  Vision? N  Difficulty concentrating or making decisions? N  Walking or climbing stairs? N  Dressing or bathing? N  Doing errands, shopping? N  Preparing Food and eating ? N  Using the Toilet? N  In the past six months, have you accidently leaked urine? N  Do you have problems with loss of bowel control? N  Managing your Medications? N  Managing your Finances? N  Housekeeping or managing your Housekeeping? N  Some recent data might be hidden    Patient Care Team: Gabriel Cirri, NP as PCP - General (Nurse Practitioner)    Assessment:   This is a routine wellness examination for Judith Hickman.  Exercise Activities and Dietary recommendations Current Exercise Habits: Home exercise routine, Type of exercise: walking, Time (Minutes): 20, Frequency (Times/Week): 5, Weekly Exercise (Minutes/Week): 100, Exercise limited by: None identified  Goals    . DIET - INCREASE WATER INTAKE     Recommend drinking at least 6-8 glasses of water a day        Fall Risk Fall Risk  09/18/2017 08/21/2016 07/27/2015  Falls in the past year? No No No   Is the patient's home free of loose throw rugs in walkways, pet beds, electrical cords, etc?   yes      Grab bars in the bathroom? yes      Handrails on the stairs?   yes      Adequate lighting?   yes  Timed Get Up and Go performed: Completed in 8 seconds with no use of assistive devices, steady gait. No intervention needed at this time.   Depression Screen PHQ 2/9 Scores 09/18/2017 08/21/2016 07/27/2015 12/31/2014  PHQ - 2 Score 0 0 0 0  PHQ- 9 Score - 0 - -     Cognitive Function     6CIT Screen 09/18/2017  What Year? 0 points  What month? 0 points  What time? 0 points  Count back from 20 0 points  Months in reverse 0 points  Repeat phrase 0 points  Total Score 0    Immunization History  Administered Date(s)  Administered  . Influenza, High Dose Seasonal PF 02/27/2017  . Influenza-Unspecified 03/09/2014, 06/07/2015, 04/09/2016  . Pneumococcal Conjugate-13 07/27/2015  . Pneumococcal Polysaccharide-23 03/01/2008  . Zoster 03/01/2008    Qualifies for Shingles Vaccine?yes, discussed shingrix vaccine.   Screening Tests Health Maintenance  Topic  Date Due  . INFLUENZA VACCINE  01/02/2018  . TETANUS/TDAP  03/01/2018  . PNA vac Low Risk Adult  Completed  . DEXA SCAN  Addressed    Cancer Screenings: Lung: Low Dose CT Chest recommended if Age 68-80 years, 30 pack-year currently smoking OR have quit w/in 15years. Patient does not qualify. Breast:  Up to date on Mammogram? Yes  No longer required due to age.  Up to date of Bone Density/Dexa? Yes Colorectal: no longer required due to age  Additional Screenings:  Hepatitis C Screening: not indicated      Plan:    I have personally reviewed and addressed the Medicare Annual Wellness questionnaire and have noted the following in the patient's chart:  A. Medical and social history B. Use of alcohol, tobacco or illicit drugs  C. Current medications and supplements D. Functional ability and status E.  Nutritional status F.  Physical activity G. Advance directives H. List of other physicians I.  Hospitalizations, surgeries, and ER visits in previous 12 months J.  Vitals K. Screenings such as hearing and vision if needed, cognitive and depression L. Referrals and appointments   In addition, I have reviewed and discussed with patient certain preventive protocols, quality metrics, and best practice recommendations. A written personalized care plan for preventive services as well as general preventive health recommendations were provided to patient.   Signed,  Marin Roberts, LPN Nurse Health Advisor   Nurse Notes:none

## 2017-09-19 ENCOUNTER — Encounter: Payer: Self-pay | Admitting: Unknown Physician Specialty

## 2017-09-19 LAB — COMPREHENSIVE METABOLIC PANEL
A/G RATIO: 1.7 (ref 1.2–2.2)
ALK PHOS: 60 IU/L (ref 39–117)
ALT: 15 IU/L (ref 0–32)
AST: 18 IU/L (ref 0–40)
Albumin: 4.4 g/dL (ref 3.5–4.7)
BILIRUBIN TOTAL: 0.3 mg/dL (ref 0.0–1.2)
BUN/Creatinine Ratio: 25 (ref 12–28)
BUN: 20 mg/dL (ref 8–27)
CHLORIDE: 100 mmol/L (ref 96–106)
CO2: 26 mmol/L (ref 20–29)
Calcium: 10.1 mg/dL (ref 8.7–10.3)
Creatinine, Ser: 0.79 mg/dL (ref 0.57–1.00)
GFR calc non Af Amer: 70 mL/min/{1.73_m2} (ref >59)
GFR, EST AFRICAN AMERICAN: 81 mL/min/{1.73_m2} (ref >59)
Globulin, Total: 2.6 g/dL (ref 1.5–4.5)
Glucose: 104 mg/dL — ABNORMAL HIGH (ref 65–99)
POTASSIUM: 4.3 mmol/L (ref 3.5–5.2)
Sodium: 140 mmol/L (ref 134–144)
Total Protein: 7 g/dL (ref 6.0–8.5)

## 2017-09-19 LAB — CBC WITH DIFFERENTIAL/PLATELET
BASOS ABS: 0 10*3/uL (ref 0.0–0.2)
Basos: 0 %
EOS (ABSOLUTE): 0.1 10*3/uL (ref 0.0–0.4)
Eos: 2 %
Hematocrit: 41.6 % (ref 34.0–46.6)
Hemoglobin: 14 g/dL (ref 11.1–15.9)
IMMATURE GRANS (ABS): 0 10*3/uL (ref 0.0–0.1)
Immature Granulocytes: 0 %
LYMPHS: 26 %
Lymphocytes Absolute: 1.5 10*3/uL (ref 0.7–3.1)
MCH: 29.6 pg (ref 26.6–33.0)
MCHC: 33.7 g/dL (ref 31.5–35.7)
MCV: 88 fL (ref 79–97)
MONOCYTES: 5 %
Monocytes Absolute: 0.3 10*3/uL (ref 0.1–0.9)
NEUTROS ABS: 3.7 10*3/uL (ref 1.4–7.0)
NEUTROS PCT: 67 %
Platelets: 299 10*3/uL (ref 150–379)
RBC: 4.73 x10E6/uL (ref 3.77–5.28)
RDW: 13.5 % (ref 12.3–15.4)
WBC: 5.6 10*3/uL (ref 3.4–10.8)

## 2017-09-19 LAB — LIPID PANEL W/O CHOL/HDL RATIO
CHOLESTEROL TOTAL: 237 mg/dL — AB (ref 100–199)
HDL: 61 mg/dL (ref >39)
LDL CALC: 144 mg/dL — AB (ref 0–99)
TRIGLYCERIDES: 159 mg/dL — AB (ref 0–149)
VLDL CHOLESTEROL CAL: 32 mg/dL (ref 5–40)

## 2017-09-19 NOTE — Progress Notes (Signed)
High cholesterol.  Patient notified by letter.

## 2017-11-01 ENCOUNTER — Other Ambulatory Visit: Payer: Self-pay | Admitting: Unknown Physician Specialty

## 2017-11-04 NOTE — Telephone Encounter (Signed)
LOV 09/18/17 Judith Hickman Last refill 07/24/17 # 90  0 refill

## 2018-01-01 ENCOUNTER — Encounter: Payer: Self-pay | Admitting: Unknown Physician Specialty

## 2018-01-01 ENCOUNTER — Ambulatory Visit
Admission: RE | Admit: 2018-01-01 | Discharge: 2018-01-01 | Disposition: A | Discharge status: Home / Self Care | Payer: Medicare PPO | Source: Ambulatory Visit | Attending: Unknown Physician Specialty | Admitting: Unknown Physician Specialty

## 2018-01-01 ENCOUNTER — Other Ambulatory Visit: Payer: Self-pay | Admitting: Unknown Physician Specialty

## 2018-01-01 DIAGNOSIS — M85852 Other specified disorders of bone density and structure, left thigh: Secondary | ICD-10-CM | POA: Diagnosis not present

## 2018-01-01 DIAGNOSIS — Z780 Asymptomatic menopausal state: Secondary | ICD-10-CM | POA: Diagnosis not present

## 2018-01-01 DIAGNOSIS — M810 Age-related osteoporosis without current pathological fracture: Secondary | ICD-10-CM | POA: Diagnosis not present

## 2018-02-05 ENCOUNTER — Other Ambulatory Visit: Payer: Self-pay | Admitting: Unknown Physician Specialty

## 2018-03-25 ENCOUNTER — Ambulatory Visit: Payer: Medicare PPO | Admitting: Nurse Practitioner

## 2018-03-25 ENCOUNTER — Ambulatory Visit: Payer: Medicare PPO | Admitting: Unknown Physician Specialty

## 2018-03-25 ENCOUNTER — Encounter: Payer: Self-pay | Admitting: Nurse Practitioner

## 2018-03-25 VITALS — BP 128/62 | HR 53 | Temp 98.6°F | Wt 130.4 lb

## 2018-03-25 DIAGNOSIS — Z23 Encounter for immunization: Secondary | ICD-10-CM

## 2018-03-25 DIAGNOSIS — I10 Essential (primary) hypertension: Secondary | ICD-10-CM | POA: Diagnosis not present

## 2018-03-25 DIAGNOSIS — D509 Iron deficiency anemia, unspecified: Secondary | ICD-10-CM

## 2018-03-25 NOTE — Progress Notes (Signed)
BP 128/62   Pulse (!) 53   Temp 98.6 F (37 C)   Wt 130 lb 6 oz (59.1 kg)   LMP  (LMP Unknown)   SpO2 94%   BMI 27.25 kg/m    Subjective:    Patient ID: Judith Hickman, female    DOB: [birth year], 84 y.o.   MRN: [id number]  HPI: Judith Hickman is a 84 y.o. female presents for HTN follow-up  Chief Complaint  Patient presents with  . Hypertension   Patient reports he goal is to live at home on her own for as long as she can.  Discussed this with her and discussed importance of maintaining chronic conditions.  HYPERTENSION: Reports recent loss of her sister and that it has caused some elevation in her BP d/t stress of the loss.  Takes BP at home daily and reports good compliance with medications.  Denies any concerns or questions with current regimen. Hypertension status: stable  Satisfied with current treatment? yes Duration of hypertension: chronic BP monitoring frequency:  daily BP range: SBP 140's and the DBP 70's, take it almost daily at home BP medication side effects:  no Medication compliance: excellent compliance Previous BP meds: does not recall Aspirin: no Recurrent headaches: no Visual changes: no Palpitations: no Dyspnea: no Chest pain: no Lower extremity edema: no Dizzy/lightheaded: no   ANEMIA Reports taking iron every day.  Denies any symptoms or recent falls. Anemia status: controlled Etiology of anemia: Duration of anemia treatment:  Compliance with treatment: excellent compliance Iron supplementation side effects: yes Severity of anemia: mild Fatigue: no Decreased exercise tolerance: no  Dyspnea on exertion: no Palpitations: no Bleeding: no Pica: no  Relevant past medical, surgical, family and social history reviewed and updated as indicated. Interim medical history since our last visit reviewed. Allergies and medications reviewed and updated.  Review of Systems  Constitutional: Negative for activity change, appetite change, diaphoresis  and fatigue.  Respiratory: Negative for cough, chest tightness and shortness of breath.   Cardiovascular: Negative for chest pain, palpitations and leg swelling.  Gastrointestinal: Negative for abdominal distention, abdominal pain, blood in stool, constipation, diarrhea, nausea and vomiting.  Neurological: Negative for dizziness, numbness and headaches.  Psychiatric/Behavioral: Negative for behavioral problems and decreased concentration. The patient is not nervous/anxious.     Per HPI unless specifically indicated above     Objective:    BP 128/62   Pulse (!) 53   Temp 98.6 F (37 C)   Wt 130 lb 6 oz (59.1 kg)   LMP  (LMP Unknown)   SpO2 94%   BMI 27.25 kg/m   Wt Readings from Last 3 Encounters:  03/25/18 130 lb 6 oz (59.1 kg)  09/18/17 132 lb 8 oz (60.1 kg)  09/18/17 132 lb 8 oz (60.1 kg)    Physical Exam  Constitutional: She is oriented to person, place, and time. She appears well-developed and well-nourished.  HENT:  Head: Normocephalic and atraumatic.  Eyes: Pupils are equal, round, and reactive to light.  Neck: Normal range of motion. Neck supple. No JVD present. Carotid bruit is not present.  Cardiovascular: Normal rate, regular rhythm and normal heart sounds.  Pulmonary/Chest: Effort normal and breath sounds normal.  Abdominal: Soft. Bowel sounds are normal.  Lymphadenopathy:    She has no cervical adenopathy.  Neurological: She is alert and oriented to person, place, and time.  Skin: Skin is warm and dry.  Psychiatric: She has a normal mood and affect. Her behavior is  normal. Judgment and thought content normal.     Results for orders placed or performed in visit on 09/18/17  Lipid Panel w/o Chol/HDL Ratio  Result Value Ref Range   Cholesterol, Total 237 (H) 100 - 199 mg/dL   Triglycerides 159 (H) 0 - 149 mg/dL   HDL 61 >39 mg/dL   VLDL Cholesterol Cal 32 5 - 40 mg/dL   LDL Calculated 144 (H) 0 - 99 mg/dL  Comprehensive metabolic panel  Result Value Ref  Range   Glucose 104 (H) 65 - 99 mg/dL   BUN 20 8 - 27 mg/dL   Creatinine, Ser 0.79 0.57 - 1.00 mg/dL   GFR calc non Af Amer 70 >59 mL/min/1.73   GFR calc Af Amer 81 >59 mL/min/1.73   BUN/Creatinine Ratio 25 12 - 28   Sodium 140 134 - 144 mmol/L   Potassium 4.3 3.5 - 5.2 mmol/L   Chloride 100 96 - 106 mmol/L   CO2 26 20 - 29 mmol/L   Calcium 10.1 8.7 - 10.3 mg/dL   Total Protein 7.0 6.0 - 8.5 g/dL   Albumin 4.4 3.5 - 4.7 g/dL   Globulin, Total 2.6 1.5 - 4.5 g/dL   Albumin/Globulin Ratio 1.7 1.2 - 2.2   Bilirubin Total 0.3 0.0 - 1.2 mg/dL   Alkaline Phosphatase 60 39 - 117 IU/L   AST 18 0 - 40 IU/L   ALT 15 0 - 32 IU/L  CBC with Differential/Platelet  Result Value Ref Range   WBC 5.6 3.4 - 10.8 x10E3/uL   RBC 4.73 3.77 - 5.28 x10E6/uL   Hemoglobin 14.0 11.1 - 15.9 g/dL   Hematocrit 41.6 34.0 - 46.6 %   MCV 88 79 - 97 fL   MCH 29.6 26.6 - 33.0 pg   MCHC 33.7 31.5 - 35.7 g/dL   RDW 13.5 12.3 - 15.4 %   Platelets 299 150 - 379 x10E3/uL   Neutrophils 67 Not Estab. %   Lymphs 26 Not Estab. %   Monocytes 5 Not Estab. %   Eos 2 Not Estab. %   Basos 0 Not Estab. %   Neutrophils Absolute 3.7 1.4 - 7.0 x10E3/uL   Lymphocytes Absolute 1.5 0.7 - 3.1 x10E3/uL   Monocytes Absolute 0.3 0.1 - 0.9 x10E3/uL   EOS (ABSOLUTE) 0.1 0.0 - 0.4 x10E3/uL   Basophils Absolute 0.0 0.0 - 0.2 x10E3/uL   Immature Granulocytes 0 Not Estab. %   Immature Grans (Abs) 0.0 0.0 - 0.1 x10E3/uL      Assessment & Plan:   Problem List Items Addressed This Visit      Cardiovascular and Mediastinum   Essential hypertension, benign    Chronic, ongoing.  Goal <140/90.  Initial BP on visit slightly elevated (recent stressor with loss of sister).  Repeat within goal range.  Continue current medication regimen.  CMP today.      Relevant Orders   CBC w/Diff   Comp Met (CMET)   Vitamin B12   Folate   Iron and TIBC   Ferritin     Other   Anemia    Chronic, stable on iron supplement.  CBC and anemia panel  today to reassess.         Other Visit Diagnoses    Immunization due    -  Primary   Relevant Orders   Flu vaccine HIGH DOSE PF (Fluzone High dose) (Completed)       Follow up plan: Return in about 6 months (around 09/24/2018) for BP  check and HTN f/u.

## 2018-03-25 NOTE — Patient Instructions (Signed)

## 2018-03-25 NOTE — Assessment & Plan Note (Signed)
Chronic, stable on iron supplement.  CBC and anemia panel today to reassess.

## 2018-03-25 NOTE — Assessment & Plan Note (Addendum)
Chronic, ongoing.  Goal <140/90.  Initial BP on visit slightly elevated (recent stressor with loss of sister).  Repeat within goal range.  Continue current medication regimen.  CMP today.

## 2018-03-26 ENCOUNTER — Other Ambulatory Visit: Payer: Self-pay | Admitting: Nurse Practitioner

## 2018-03-26 DIAGNOSIS — E875 Hyperkalemia: Secondary | ICD-10-CM

## 2018-03-26 LAB — CBC WITH DIFFERENTIAL/PLATELET
BASOS ABS: 0 10*3/uL (ref 0.0–0.2)
BASOS: 1 %
EOS (ABSOLUTE): 0.2 10*3/uL (ref 0.0–0.4)
Eos: 3 %
HEMOGLOBIN: 13.9 g/dL (ref 11.1–15.9)
Hematocrit: 42 % (ref 34.0–46.6)
IMMATURE GRANS (ABS): 0 10*3/uL (ref 0.0–0.1)
Immature Granulocytes: 0 %
LYMPHS ABS: 1.8 10*3/uL (ref 0.7–3.1)
Lymphs: 30 %
MCH: 29.4 pg (ref 26.6–33.0)
MCHC: 33.1 g/dL (ref 31.5–35.7)
MCV: 89 fL (ref 79–97)
Monocytes Absolute: 0.3 10*3/uL (ref 0.1–0.9)
Monocytes: 5 %
NEUTROS ABS: 3.5 10*3/uL (ref 1.4–7.0)
Neutrophils: 61 %
PLATELETS: 291 10*3/uL (ref 150–450)
RBC: 4.73 x10E6/uL (ref 3.77–5.28)
RDW: 12.2 % — AB (ref 12.3–15.4)
WBC: 5.8 10*3/uL (ref 3.4–10.8)

## 2018-03-26 LAB — COMPREHENSIVE METABOLIC PANEL
ALK PHOS: 56 IU/L (ref 39–117)
ALT: 13 IU/L (ref 0–32)
AST: 15 IU/L (ref 0–40)
Albumin/Globulin Ratio: 2 (ref 1.2–2.2)
Albumin: 4.6 g/dL (ref 3.5–4.7)
BUN/Creatinine Ratio: 19 (ref 12–28)
BUN: 15 mg/dL (ref 8–27)
Bilirubin Total: 0.3 mg/dL (ref 0.0–1.2)
CO2: 26 mmol/L (ref 20–29)
CREATININE: 0.78 mg/dL (ref 0.57–1.00)
Calcium: 10.1 mg/dL (ref 8.7–10.3)
Chloride: 101 mmol/L (ref 96–106)
GFR calc Af Amer: 82 mL/min/{1.73_m2} (ref >59)
GFR calc non Af Amer: 72 mL/min/{1.73_m2} (ref >59)
GLUCOSE: 102 mg/dL — AB (ref 65–99)
Globulin, Total: 2.3 g/dL (ref 1.5–4.5)
Potassium: 5.3 mmol/L — ABNORMAL HIGH (ref 3.5–5.2)
SODIUM: 142 mmol/L (ref 134–144)
Total Protein: 6.9 g/dL (ref 6.0–8.5)

## 2018-03-26 LAB — FOLATE: FOLATE: 17.6 ng/mL (ref >3.0)

## 2018-03-26 LAB — IRON AND TIBC
Iron Saturation: 33 % (ref 15–55)
Iron: 108 ug/dL (ref 27–139)
Total Iron Binding Capacity: 329 ug/dL (ref 250–450)
UIBC: 221 ug/dL (ref 118–369)

## 2018-03-26 LAB — FERRITIN: Ferritin: 85 ng/mL (ref 15–150)

## 2018-03-26 LAB — VITAMIN B12: VITAMIN B 12: 361 pg/mL (ref 232–1245)

## 2018-03-26 NOTE — Progress Notes (Signed)
Reviewed labs.  Potassium slightly elevated at 5.3.  Elevated from her baseline 4-5 range.  Reviewed medication list.  Advised patient if taking supplements or multivitamin at home to hold those for one week.  Encourage adequate fluid intake.  Will have her come in 4 weeks for lab draw only to recheck potassium level.

## 2018-04-02 ENCOUNTER — Other Ambulatory Visit: Payer: Self-pay | Admitting: Unknown Physician Specialty

## 2018-04-25 ENCOUNTER — Other Ambulatory Visit: Payer: Medicare PPO

## 2018-04-25 DIAGNOSIS — E875 Hyperkalemia: Secondary | ICD-10-CM | POA: Diagnosis not present

## 2018-04-26 LAB — POTASSIUM: Potassium: 4.3 mmol/L (ref 3.5–5.2)

## 2018-04-26 NOTE — Progress Notes (Signed)
Normal test results noted.  Please call patient and make them aware of normal results and will continue to monitor at regular visits.  Have a great day

## 2018-05-06 ENCOUNTER — Other Ambulatory Visit: Payer: Self-pay | Admitting: Physician Assistant

## 2018-05-09 ENCOUNTER — Other Ambulatory Visit: Payer: Self-pay | Admitting: Physician Assistant

## 2018-08-04 ENCOUNTER — Other Ambulatory Visit: Payer: Self-pay | Admitting: Nurse Practitioner

## 2018-08-07 ENCOUNTER — Other Ambulatory Visit: Payer: Self-pay | Admitting: Nurse Practitioner

## 2018-09-18 ENCOUNTER — Telehealth: Payer: Self-pay

## 2018-09-18 ENCOUNTER — Other Ambulatory Visit: Payer: Self-pay | Admitting: Nurse Practitioner

## 2018-09-18 DIAGNOSIS — E559 Vitamin D deficiency, unspecified: Secondary | ICD-10-CM

## 2018-09-18 DIAGNOSIS — I10 Essential (primary) hypertension: Secondary | ICD-10-CM

## 2018-09-18 DIAGNOSIS — D509 Iron deficiency anemia, unspecified: Secondary | ICD-10-CM

## 2018-09-18 NOTE — Telephone Encounter (Signed)
Patient scheduled for an AWV on 09/24/2018 with [facility], Due to Covid-19 pandemic this is unable to be done in office, called patient to see if they are able to do this virtually or if it needed to rescheduled for an in office for after June 2020. Unable to leave a message

## 2018-09-18 NOTE — Progress Notes (Signed)
Future lab orders only. 

## 2018-09-24 ENCOUNTER — Ambulatory Visit (INDEPENDENT_AMBULATORY_CARE_PROVIDER_SITE_OTHER): Payer: Medicare PPO | Admitting: Family Medicine

## 2018-09-24 ENCOUNTER — Other Ambulatory Visit: Payer: Self-pay

## 2018-09-24 ENCOUNTER — Ambulatory Visit (INDEPENDENT_AMBULATORY_CARE_PROVIDER_SITE_OTHER): Payer: Medicare PPO

## 2018-09-24 ENCOUNTER — Encounter: Payer: Self-pay | Admitting: Family Medicine

## 2018-09-24 ENCOUNTER — Ambulatory Visit: Payer: Medicare PPO | Admitting: Nurse Practitioner

## 2018-09-24 VITALS — BP 129/68 | HR 56 | Temp 97.8°F | Ht <= 58 in | Wt 130.0 lb

## 2018-09-24 DIAGNOSIS — M810 Age-related osteoporosis without current pathological fracture: Secondary | ICD-10-CM

## 2018-09-24 DIAGNOSIS — Z0000 Encounter for general adult medical examination without abnormal findings: Secondary | ICD-10-CM

## 2018-09-24 DIAGNOSIS — I10 Essential (primary) hypertension: Secondary | ICD-10-CM | POA: Diagnosis not present

## 2018-09-24 MED ORDER — RALOXIFENE HCL 60 MG PO TABS: 60.0000 mg | ORAL_TABLET | Freq: Every day | ORAL | 1 refills | Status: DC

## 2018-09-24 MED ORDER — METOPROLOL SUCCINATE ER 50 MG PO TB24: ORAL_TABLET | ORAL | 1 refills | Status: DC

## 2018-09-24 MED ORDER — AMLODIPINE BESYLATE 10 MG PO TABS: 10.0000 mg | ORAL_TABLET | Freq: Every day | ORAL | 1 refills | Status: DC

## 2018-09-24 MED ORDER — HYDROCHLOROTHIAZIDE 12.5 MG PO CAPS: ORAL_CAPSULE | ORAL | 1 refills | Status: DC

## 2018-09-24 NOTE — Assessment & Plan Note (Signed)
Stable on current regimen, continue present medications. Continue monitoring home readings, f/u if persistently abnormal. DASH diet, exercise reviewed

## 2018-09-24 NOTE — Patient Instructions (Signed)
Judith Hickman , Thank you for taking time to come for your Medicare Wellness Visit. I appreciate your ongoing commitment to your health goals. Please review the following plan we discussed and let me know if I can assist you in the future.   Screening recommendations/referrals: Colonoscopy: no longer required Mammogram: no longer required Bone Density: completed 01/01/2018 Recommended yearly ophthalmology/optometry visit for glaucoma screening and checkup Recommended yearly dental visit for hygiene and checkup  Vaccinations: Influenza vaccine: up to date Pneumococcal vaccine: up to date Tdap vaccine: due, check with your insurance company for coverage Shingles vaccine: shingrix eligible, check with your insurance company for coverage     Advanced directives: copy on file  Conditions/risks identified: none  Next appointment: Follow up in one year for your annual wellness exam.    Preventive Care 65 Years and Older, Female Preventive care refers to lifestyle choices and visits with your health care provider that can promote health and wellness. What does preventive care include?  A yearly physical exam. This is also called an annual well check.  Dental exams once or twice a year.  Routine eye exams. Ask your health care provider how often you should have your eyes checked.  Personal lifestyle choices, including:  Daily care of your teeth and gums.  Regular physical activity.  Eating a healthy diet.  Avoiding tobacco and drug use.  Limiting alcohol use.  Practicing safe sex.  Taking low-dose aspirin every day.  Taking vitamin and mineral supplements as recommended by your health care provider. What happens during an annual well check? The services and screenings done by your health care provider during your annual well check will depend on your age, overall health, lifestyle risk factors, and family history of disease. Counseling  Your health care provider may ask you  questions about your:  Alcohol use.  Tobacco use.  Drug use.  Emotional well-being.  Home and relationship well-being.  Sexual activity.  Eating habits.  History of falls.  Memory and ability to understand (cognition).  Work and work Astronomer.  Reproductive health. Screening  You may have the following tests or measurements:  Height, weight, and BMI.  Blood pressure.  Lipid and cholesterol levels. These may be checked every 5 years, or more frequently if you are over 66 years old.  Skin check.  Lung cancer screening. You may have this screening every year starting at age 34 if you have a 30-pack-year history of smoking and currently smoke or have quit within the past 15 years.  Fecal occult blood test (FOBT) of the stool. You may have this test every year starting at age 48.  Flexible sigmoidoscopy or colonoscopy. You may have a sigmoidoscopy every 5 years or a colonoscopy every 10 years starting at age 36.  Hepatitis C blood test.  Hepatitis B blood test.  Sexually transmitted disease (STD) testing.  Diabetes screening. This is done by checking your blood sugar (glucose) after you have not eaten for a while (fasting). You may have this done every 1-3 years.  Bone density scan. This is done to screen for osteoporosis. You may have this done starting at age 22.  Mammogram. This may be done every 1-2 years. Talk to your health care provider about how often you should have regular mammograms. Talk with your health care provider about your test results, treatment options, and if necessary, the need for more tests. Vaccines  Your health care provider may recommend certain vaccines, such as:  Influenza vaccine. This is recommended  every year.  Tetanus, diphtheria, and acellular pertussis (Tdap, Td) vaccine. You may need a Td booster every 10 years.  Zoster vaccine. You may need this after age 50.  Pneumococcal 13-valent conjugate (PCV13) vaccine. One dose is  recommended after age 14.  Pneumococcal polysaccharide (PPSV23) vaccine. One dose is recommended after age 61. Talk to your health care provider about which screenings and vaccines you need and how often you need them. This information is not intended to replace advice given to you by your health care provider. Make sure you discuss any questions you have with your health care provider. Document Released: 06/17/2015 Document Revised: 02/08/2016 Document Reviewed: 03/22/2015 Elsevier Interactive Patient Education  2017 [place] Prevention in the Home Falls can cause injuries. They can happen to people of all ages. There are many things you can do to make your home safe and to help prevent falls. What can I do on the outside of my home?  Regularly fix the edges of walkways and driveways and fix any cracks.  Remove anything that might make you trip as you walk through a door, such as a raised step or threshold.  Trim any bushes or trees on the path to your home.  Use bright outdoor lighting.  Clear any walking paths of anything that might make someone trip, such as rocks or tools.  Regularly check to see if handrails are loose or broken. Make sure that both sides of any steps have handrails.  Any raised decks and porches should have guardrails on the edges.  Have any leaves, snow, or ice cleared regularly.  Use sand or salt on walking paths during winter.  Clean up any spills in your garage right away. This includes oil or grease spills. What can I do in the bathroom?  Use night lights.  Install grab bars by the toilet and in the tub and shower. Do not use towel bars as grab bars.  Use non-skid mats or decals in the tub or shower.  If you need to sit down in the shower, use a plastic, non-slip stool.  Keep the floor dry. Clean up any water that spills on the floor as soon as it happens.  Remove soap buildup in the tub or shower regularly.  Attach bath mats  securely with double-sided non-slip rug tape.  Do not have throw rugs and other things on the floor that can make you trip. What can I do in the bedroom?  Use night lights.  Make sure that you have a light by your bed that is easy to reach.  Do not use any sheets or blankets that are too big for your bed. They should not hang down onto the floor.  Have a firm chair that has side arms. You can use this for support while you get dressed.  Do not have throw rugs and other things on the floor that can make you trip. What can I do in the kitchen?  Clean up any spills right away.  Avoid walking on wet floors.  Keep items that you use a lot in easy-to-reach places.  If you need to reach something above you, use a strong step stool that has a grab bar.  Keep electrical cords out of the way.  Do not use floor polish or wax that makes floors slippery. If you must use wax, use non-skid floor wax.  Do not have throw rugs and other things on the floor that can make you trip. What  can I do with my stairs?  Do not leave any items on the stairs.  Make sure that there are handrails on both sides of the stairs and use them. Fix handrails that are broken or loose. Make sure that handrails are as long as the stairways.  Check any carpeting to make sure that it is firmly attached to the stairs. Fix any carpet that is loose or worn.  Avoid having throw rugs at the top or bottom of the stairs. If you do have throw rugs, attach them to the floor with carpet tape.  Make sure that you have a light switch at the top of the stairs and the bottom of the stairs. If you do not have them, ask someone to add them for you. What else can I do to help prevent falls?  Wear shoes that:  Do not have high heels.  Have rubber bottoms.  Are comfortable and fit you well.  Are closed at the toe. Do not wear sandals.  If you use a stepladder:  Make sure that it is fully opened. Do not climb a closed  stepladder.  Make sure that both sides of the stepladder are locked into place.  Ask someone to hold it for you, if possible.  Clearly mark and make sure that you can see:  Any grab bars or handrails.  First and last steps.  Where the edge of each step is.  Use tools that help you move around (mobility aids) if they are needed. These include:  Canes.  Walkers.  Scooters.  Crutches.  Turn on the lights when you go into a dark area. Replace any light bulbs as soon as they burn out.  Set up your furniture so you have a clear path. Avoid moving your furniture around.  If any of your floors are uneven, fix them.  If there are any pets around you, be aware of where they are.  Review your medicines with your doctor. Some medicines can make you feel dizzy. This can increase your chance of falling. Ask your doctor what other things that you can do to help prevent falls. This information is not intended to replace advice given to you by your health care provider. Make sure you discuss any questions you have with your health care provider. Document Released: 03/17/2009 Document Revised: 10/27/2015 Document Reviewed: 06/25/2014 Elsevier Interactive Patient Education  2017 [employer].

## 2018-09-24 NOTE — Progress Notes (Signed)
BP 129/68   Pulse (!) 56   Temp 97.8 F (36.6 C) (Oral)   Ht 4\' 10"  (1.473 m)   Wt 130 lb (59 kg)   LMP  (LMP Unknown)   BMI 27.17 kg/m    Subjective:    Patient ID: Judith Hickman, female    DOB: [birth year], 85 y.o.   MRN: [id number]  HPI: Judith Hickman is a 85 y.o. female  Chief Complaint  Patient presents with  . Hypertension    f/u    . This visit was completed via telephone due to the restrictions of the COVID-19 pandemic. All issues as above were discussed and addressed but no physical exam was performed. If it was felt that the patient should be evaluated in the office, they were directed there. The patient verbally consented to this visit. Patient was unable to complete an audio/visual visit due to Technical difficulties,Lack of internet. Due to the catastrophic nature of the COVID-19 pandemic, this visit was done through audio contact only. . Location of the patient: home . Location of the provider: work . Those involved with this call:  . Provider: Roosvelt Maserachel , PA-C . CMA: Elton SinAnita Quito, CMA . Front Desk/Registration: Harriet PhoJoliza Johnson  . Time spent on call: 15 minutes on the phone discussing health concerns. 5 minutes total spent in review of patient's record and preparation of their chart.  I verified patient identity using two factors (patient name and date of birth). Patient consents verbally to being seen via telemedicine visit today.   Patient presenting today for 6 month f/u HTN. Taking her medications faithfully without side effects. Currently on HCTZ, metoprolol, and amlodipine. Home BPs typically running 120s/70s. No CP, SOB, HAs, dizziness. Eating well and staying active. No new concerns today.   Taking evista and OTC vit D for osteoporosis. Tolerating well, no side effects.   Past Medical History:  Diagnosis Date  . Back pain   . Chronic kidney disease   . Costalchondritis   . Hyperlipidemia   . Hypertension   . Hypertensive CKD (chronic kidney  disease)   . Iron deficiency anemia   . Mitral valve regurgitation   . Osteoporosis   . Rotator cuff syndrome of left shoulder    Social History   Socioeconomic History  . Marital status: Divorced    Spouse name: Not on file  . Number of children: Not on file  . Years of education: Not on file  . Highest education level: 9th grade  Occupational History  . Not on file  Social Needs  . Financial resource strain: Not hard at all  . Food insecurity:    Worry: Never true    Inability: Never true  . Transportation needs:    Medical: No    Non-medical: No  Tobacco Use  . Smoking status: Never Smoker  . Smokeless tobacco: Never Used  Substance and Sexual Activity  . Alcohol use: No    Alcohol/week: 0.0 standard drinks  . Drug use: No  . Sexual activity: Not Currently  Lifestyle  . Physical activity:    Days per week: 0 days    Minutes per session: 0 min  . Stress: Not at all  Relationships  . Social connections:    Talks on phone: More than three times a week    Gets together: Once a week    Attends religious service: More than 4 times per year    Active member of club or organization: No  Attends meetings of clubs or organizations: Never    Relationship status: Divorced  . Intimate partner violence:    Fear of current or ex partner: No    Emotionally abused: No    Physically abused: No    Forced sexual activity: No  Other Topics Concern  . Not on file  Social History Narrative  . Not on file   Relevant past medical, surgical, family and social history reviewed and updated as indicated. Interim medical history since our last visit reviewed. Allergies and medications reviewed and updated.  Review of Systems  Per HPI unless specifically indicated above     Objective:    BP 129/68   Pulse (!) 56   Temp 97.8 F (36.6 C) (Oral)   Ht 4\' 10"  (1.473 m)   Wt 130 lb (59 kg)   LMP  (LMP Unknown)   BMI 27.17 kg/m   Wt Readings from Last 3 Encounters:  09/24/18  130 lb (59 kg)  09/24/18 130 lb (59 kg)  03/25/18 130 lb 6 oz (59.1 kg)    Physical Exam Unable to perform PE due to patient unable to do video visit.   Results for orders placed or performed in visit on 04/25/18  Potassium  Result Value Ref Range   Potassium 4.3 3.5 - 5.2 mmol/L      Assessment & Plan:   Problem List Items Addressed This Visit      Cardiovascular and Mediastinum   Essential hypertension, benign - Primary    Stable on current regimen, continue present medications. Continue monitoring home readings, f/u if persistently abnormal. DASH diet, exercise reviewed      Relevant Medications   amLODipine (NORVASC) 10 MG tablet   hydrochlorothiazide (MICROZIDE) 12.5 MG capsule   metoprolol succinate (TOPROL-XL) 50 MG 24 hr tablet     Musculoskeletal and Integument   Osteoporosis    Stable, continue current regimen and good OTC supplementation. Weight bearing exercise recommended      Relevant Medications   raloxifene (EVISTA) 60 MG tablet       Follow up plan: Return in about 6 months (around 03/26/2019) for CPE.

## 2018-09-24 NOTE — Progress Notes (Signed)
Subjective:   Judith Hickman is a 85 y.o. female who presents for Medicare Annual (Subsequent) preventive examination.  This visit is being conducted via phone call due to the COVID-19 pandemic. This patient has given me verbal consent via phone to conduct this visit, patient states they are participating from their home address. Some vital signs may be absent or patient reported.   Patient identification: identified by name, DOB, and current address.    Review of Systems:  Cardiac Risk Factors include: advanced age (>4655men, 82>65 women);hypertension     Objective:      Vitals provided by patient.   Vitals: BP 129/68 Comment: patient reported  Pulse (!) 56 Comment: patient reported  Temp 97.8 F (36.6 C) Comment: patient reported  Ht 4\' 10"  (1.473 m) Comment: patient reported  Wt 130 lb (59 kg) Comment: patient reported  LMP  (LMP Unknown)   BMI 27.17 kg/m   Body mass index is 27.17 kg/m.  Advanced Directives 09/24/2018 09/18/2017 08/21/2016 07/27/2015 07/27/2015  Does Patient Have a Medical Advance Directive? Yes Yes No No No  Type of Estate agentAdvance Directive Healthcare Power of BadgerAttorney;Living will Healthcare Power of Attorney - - -  Copy of Healthcare Power of Attorney in Chart? Yes - validated most recent copy scanned in chart (See row information) Yes - - -  Would patient like information on creating a medical advance directive? - - No - Patient declined Yes - Educational materials given -    Tobacco Social History   Tobacco Use  Smoking Status Never Smoker  Smokeless Tobacco Never Used     Counseling given: Not Answered   Clinical Intake:  Pre-visit preparation completed: Yes  Pain : No/denies pain     Nutritional Risks: None  How often do you need to have someone help you when you read instructions, pamphlets, or other written materials from your doctor or pharmacy?: 1 - Never  Interpreter Needed?: No  Information entered by :: Tiffany Hill,LPN   Past  Medical History:  Diagnosis Date  . Back pain   . Chronic kidney disease   . Costalchondritis   . Hyperlipidemia   . Hypertension   . Hypertensive CKD (chronic kidney disease)   . Iron deficiency anemia   . Mitral valve regurgitation   . Osteoporosis   . Rotator cuff syndrome of left shoulder    Past Surgical History:  Procedure Laterality Date  . ABDOMINAL HYSTERECTOMY    . BLADDER REPAIR     X 2  . CARPAL TUNNEL RELEASE     Family History  Problem Relation Age of Onset  . Diabetes Mother   . Heart disease Father    Social History   Socioeconomic History  . Marital status: Divorced    Spouse name: Not on file  . Number of children: Not on file  . Years of education: Not on file  . Highest education level: 9th grade  Occupational History  . Not on file  Social Needs  . Financial resource strain: Not hard at all  . Food insecurity:    Worry: Never true    Inability: Never true  . Transportation needs:    Medical: No    Non-medical: No  Tobacco Use  . Smoking status: Never Smoker  . Smokeless tobacco: Never Used  Substance and Sexual Activity  . Alcohol use: No    Alcohol/week: 0.0 standard drinks  . Drug use: No  . Sexual activity: Not Currently  Lifestyle  . Physical  activity:    Days per week: 0 days    Minutes per session: 0 min  . Stress: Not at all  Relationships  . Social connections:    Talks on phone: More than three times a week    Gets together: Once a week    Attends religious service: More than 4 times per year    Active member of club or organization: No    Attends meetings of clubs or organizations: Never    Relationship status: Divorced  Other Topics Concern  . Not on file  Social History Narrative  . Not on file    Outpatient Encounter Medications as of 09/24/2018  Medication Sig  . IRON PO Take by mouth.   Marland Kitchen VITAMIN D, CHOLECALCIFEROL, PO Take by mouth.   . [DISCONTINUED] amLODipine (NORVASC) 10 MG tablet TAKE 1 TABLET BY MOUTH  ONCE DAILY  . [DISCONTINUED] hydrochlorothiazide (MICROZIDE) 12.5 MG capsule TAKE 1 CAPSULE BY MOUTH ONCE DAILY *STOP  TRIAMTERENE  /  HCTZ*  . [DISCONTINUED] metoprolol succinate (TOPROL-XL) 50 MG 24 hr tablet TAKE 1 TABLET BY MOUTH ONCE DAILY TAKE  WITH  OR  IMMEDIATELY  FOLLOWING  A  MEAL  . [DISCONTINUED] raloxifene (EVISTA) 60 MG tablet TAKE 1 TABLET BY MOUTH ONCE DAILY   No facility-administered encounter medications on file as of 09/24/2018.     Activities of Daily Living In your present state of health, do you have any difficulty performing the following activities: 09/24/2018  Hearing? N  Comment sometimes   Vision? N  Difficulty concentrating or making decisions? N  Walking or climbing stairs? N  Dressing or bathing? N  Doing errands, shopping? N  Preparing Food and eating ? N  Using the Toilet? N  In the past six months, have you accidently leaked urine? N  Do you have problems with loss of bowel control? N  Managing your Medications? N  Managing your Finances? N  Housekeeping or managing your Housekeeping? N  Some recent data might be hidden    Patient Care Team: Gabriel Cirri, NP as PCP - General (Nurse Practitioner)    Assessment:   This is a routine wellness examination for Judith Hickman.  Exercise Activities and Dietary recommendations Current Exercise Habits: Home exercise routine, Type of exercise: walking, Time (Minutes): 20, Frequency (Times/Week): 7, Weekly Exercise (Minutes/Week): 140, Intensity: Mild, Exercise limited by: None identified  Goals    . DIET - INCREASE WATER INTAKE     Recommend drinking at least 6-8 glasses of water a day        Fall Risk: Fall Risk  09/24/2018 09/18/2017 08/21/2016 07/27/2015  Falls in the past year? 0 No No No    FALL RISK PREVENTION PERTAINING TO THE HOME:  Any stairs in or around the home? No  If so, are there any without handrails? n/a  Home free of loose throw rugs in walkways, pet beds, electrical cords, etc? Yes   Adequate lighting in your home to reduce risk of falls? Yes   ASSISTIVE DEVICES UTILIZED TO PREVENT FALLS:  Life alert? No  Use of a cane, walker or w/c? No  Grab bars in the bathroom? No  Shower chair or bench in shower? No  Elevated toilet seat or a handicapped toilet? No   DME ORDERS:  DME order needed?  No   TIMED UP AND GO:  Unable to perform    Depression Screen PHQ 2/9 Scores 09/24/2018 09/18/2017 08/21/2016 07/27/2015  PHQ - 2 Score 0 0 0  0  PHQ- 9 Score - - 0 -     Cognitive Function     6CIT Screen 09/24/2018 09/18/2017  What Year? 0 points 0 points  What month? 0 points 0 points  What time? 0 points 0 points  Count back from 20 0 points 0 points  Months in reverse 0 points 0 points  Repeat phrase 0 points 0 points  Total Score 0 0    Immunization History  Administered Date(s) Administered  . Influenza, High Dose Seasonal PF 02/27/2017, 03/25/2018  . Influenza-Unspecified 03/09/2014, 06/07/2015, 04/09/2016  . Pneumococcal Conjugate-13 07/27/2015  . Pneumococcal Polysaccharide-23 03/01/2008  . Zoster 03/01/2008    Qualifies for Shingles Vaccine? Yes  Zostavax completed 03/01/2008 Due for Shingrix. Education has been provided regarding the importance of this vaccine. Pt has been advised to call insurance company to determine out of pocket expense. Advised may also receive vaccine at local pharmacy or Health Dept. Verbalized acceptance and understanding.  Tdap: Although this vaccine is not a covered service during a Wellness Exam, does the patient still wish to receive this vaccine today?  Yes .  Education has been provided regarding the importance of this vaccine. Advised may receive this vaccine at local pharmacy or Health Dept. Aware to provide a copy of the vaccination record if obtained from local pharmacy or Health Dept. Verbalized acceptance and understanding.  Flu Vaccine: up to date   Pneumococcal Vaccine: up to date   Screening Tests Health  Maintenance  Topic Date Due  . TETANUS/TDAP  03/01/2018  . INFLUENZA VACCINE  01/03/2019  . PNA vac Low Risk Adult  Completed  . DEXA SCAN  Addressed    Cancer Screenings:  Colorectal Screening: no longer required   Mammogram: no longer required  Bone Density: Completed 01/01/2018.   Additional Screening:  Hepatitis C Screening: does not qualify  Vision Screening: Recommended annual ophthalmology exams for early detection of glaucoma and other disorders of the eye. Is the patient up to date with their annual eye exam?  No  Who is the provider or what is the name of the office in which the pt attends annual eye exams? Dr.Woodard- goes as needed   Dental Screening: Recommended annual dental exams for proper oral hygiene  Community Resource Referral:  CRR required this visit?  No       Plan:  I have personally reviewed and addressed the Medicare Annual Wellness questionnaire and have noted the following in the patient's chart:  A. Medical and social history B. Use of alcohol, tobacco or illicit drugs  C. Current medications and supplements D. Functional ability and status E.  Nutritional status F.  Physical activity G. Advance directives H. List of other physicians I.  Hospitalizations, surgeries, and ER visits in previous 12 months J.  Vitals K. Screenings such as hearing and vision if needed, cognitive and depression L. Referrals and appointments   In addition, I have reviewed and discussed with patient certain preventive protocols, quality metrics, and best practice recommendations. A written personalized care plan for preventive services as well as general preventive health recommendations were provided to patient. Nurse Health Advisor  Signed,    [place], Truddie [place], [place]  01/09/8109 Nurse Health Advisor   Nurse Notes: none

## 2018-09-25 NOTE — Assessment & Plan Note (Addendum)
Stable, continue current regimen and good OTC supplementation. Weight bearing exercise recommended

## 2018-09-30 ENCOUNTER — Other Ambulatory Visit: Payer: Medicare PPO

## 2018-10-30 ENCOUNTER — Other Ambulatory Visit: Payer: Medicare PPO

## 2018-11-28 ENCOUNTER — Other Ambulatory Visit: Payer: Medicare PPO

## 2018-11-28 ENCOUNTER — Other Ambulatory Visit: Payer: Self-pay

## 2018-11-28 DIAGNOSIS — D509 Iron deficiency anemia, unspecified: Secondary | ICD-10-CM | POA: Diagnosis not present

## 2018-11-28 DIAGNOSIS — E559 Vitamin D deficiency, unspecified: Secondary | ICD-10-CM

## 2018-11-28 DIAGNOSIS — I10 Essential (primary) hypertension: Secondary | ICD-10-CM | POA: Diagnosis not present

## 2018-11-29 LAB — CBC WITH DIFFERENTIAL/PLATELET
Basophils Absolute: 0 10*3/uL (ref 0.0–0.2)
Basos: 1 %
EOS (ABSOLUTE): 0.2 10*3/uL (ref 0.0–0.4)
Eos: 4 %
Hematocrit: 40.8 % (ref 34.0–46.6)
Hemoglobin: 13.6 g/dL (ref 11.1–15.9)
Immature Grans (Abs): 0 10*3/uL (ref 0.0–0.1)
Immature Granulocytes: 0 %
Lymphocytes Absolute: 1.7 10*3/uL (ref 0.7–3.1)
Lymphs: 34 %
MCH: 29.4 pg (ref 26.6–33.0)
MCHC: 33.3 g/dL (ref 31.5–35.7)
MCV: 88 fL (ref 79–97)
Monocytes Absolute: 0.3 10*3/uL (ref 0.1–0.9)
Monocytes: 5 %
Neutrophils Absolute: 2.7 10*3/uL (ref 1.4–7.0)
Neutrophils: 56 %
Platelets: 267 10*3/uL (ref 150–450)
RBC: 4.62 x10E6/uL (ref 3.77–5.28)
RDW: 12.3 % (ref 11.7–15.4)
WBC: 4.9 10*3/uL (ref 3.4–10.8)

## 2018-11-29 LAB — BASIC METABOLIC PANEL
BUN/Creatinine Ratio: 24 (ref 12–28)
BUN: 20 mg/dL (ref 8–27)
CO2: 25 mmol/L (ref 20–29)
Calcium: 9.9 mg/dL (ref 8.7–10.3)
Chloride: 102 mmol/L (ref 96–106)
Creatinine, Ser: 0.82 mg/dL (ref 0.57–1.00)
GFR calc Af Amer: 77 mL/min/{1.73_m2} (ref >59)
GFR calc non Af Amer: 67 mL/min/{1.73_m2} (ref >59)
Glucose: 94 mg/dL (ref 65–99)
Potassium: 4.1 mmol/L (ref 3.5–5.2)
Sodium: 140 mmol/L (ref 134–144)

## 2018-11-29 LAB — VITAMIN D 25 HYDROXY (VIT D DEFICIENCY, FRACTURES): Vit D, 25-Hydroxy: 38.3 ng/mL (ref 30.0–100.0)

## 2018-11-29 NOTE — Progress Notes (Signed)
Normal test results noted.  Please call patient and make them aware of normal results and will continue to monitor at regular visits.  Have a great day.  Look forward to seeing you at your next visit.

## 2019-03-30 ENCOUNTER — Encounter: Payer: Medicare PPO | Admitting: Nurse Practitioner

## 2019-03-31 ENCOUNTER — Encounter: Payer: Medicare PPO | Admitting: Nurse Practitioner

## 2019-04-14 ENCOUNTER — Other Ambulatory Visit: Payer: Self-pay | Admitting: Family Medicine

## 2019-04-27 ENCOUNTER — Encounter: Payer: Medicare PPO | Admitting: Nurse Practitioner

## 2019-05-21 ENCOUNTER — Other Ambulatory Visit: Payer: Self-pay

## 2019-05-21 ENCOUNTER — Ambulatory Visit (INDEPENDENT_AMBULATORY_CARE_PROVIDER_SITE_OTHER): Payer: Medicare PPO | Admitting: Unknown Physician Specialty

## 2019-05-21 ENCOUNTER — Encounter: Payer: Self-pay | Admitting: Unknown Physician Specialty

## 2019-05-21 DIAGNOSIS — M8588 Other specified disorders of bone density and structure, other site: Secondary | ICD-10-CM

## 2019-05-21 DIAGNOSIS — I10 Essential (primary) hypertension: Secondary | ICD-10-CM | POA: Diagnosis not present

## 2019-05-21 DIAGNOSIS — Z862 Personal history of diseases of the blood and blood-forming organs and certain disorders involving the immune mechanism: Secondary | ICD-10-CM | POA: Diagnosis not present

## 2019-05-21 DIAGNOSIS — M8580 Other specified disorders of bone density and structure, unspecified site: Secondary | ICD-10-CM | POA: Insufficient documentation

## 2019-05-21 MED ORDER — METOPROLOL SUCCINATE ER 50 MG PO TB24: ORAL_TABLET | ORAL | 3 refills | Status: DC

## 2019-05-21 MED ORDER — RALOXIFENE HCL 60 MG PO TABS: 60.0000 mg | ORAL_TABLET | Freq: Every day | ORAL | 3 refills | Status: DC

## 2019-05-21 MED ORDER — AMLODIPINE BESYLATE 10 MG PO TABS: 10.0000 mg | ORAL_TABLET | Freq: Every day | ORAL | 3 refills | Status: DC

## 2019-05-21 MED ORDER — HYDROCHLOROTHIAZIDE 12.5 MG PO CAPS: ORAL_CAPSULE | ORAL | 3 refills | Status: DC

## 2019-05-21 NOTE — Progress Notes (Signed)
BP (!) 165/72   Pulse 60   Temp 97.6 F (36.4 C) (Oral)   Ht 4\' 10"  (1.473 m)   Wt 129 lb (58.5 kg)   LMP  (LMP Unknown)   SpO2 96%   BMI 26.96 kg/m    Subjective:    Patient ID: Judith Hickman, female    DOB: 08-22-1933, 85 y.o.   MRN: [id number]  HPI: Judith Hickman is a 85 y.o. female  Chief Complaint  Patient presents with  . Annual Exam  . Medication Refill    amlodipine   Hypertension Using medications without difficulty Average home BPs 130's/76 at home.  Tends to run high here   No problems or lightheadedness No chest pain with exertion or shortness of breath No Edema    Depression screen [facility] 2/9 05/21/2019 09/24/2018 09/18/2017 08/21/2016 07/27/2015  Decreased Interest 0 0 0 0 0  Down, Depressed, Hopeless 0 0 0 0 0  PHQ - 2 Score 0 0 0 0 0  Altered sleeping - - - 0 -  Tired, decreased energy - - - 0 -  Change in appetite - - - 0 -  Feeling bad or failure about yourself  - - - 0 -  Trouble concentrating - - - 0 -  Moving slowly or fidgety/restless - - - 0 -  Suicidal thoughts - - - 0 -  PHQ-9 Score - - - 0 -       Relevant past medical, surgical, family and social history reviewed and updated as indicated. Interim medical history since our last visit reviewed. Allergies and medications reviewed and updated.  Review of Systems  Constitutional: Negative.   HENT: Negative.   Eyes: Negative.   Respiratory: Negative.   Cardiovascular: Negative.   Gastrointestinal: Negative.   Endocrine: Negative.   Genitourinary: Negative.   Musculoskeletal: Negative.   Allergic/Immunologic: Negative.   Neurological: Negative.   Hematological: Negative.   Psychiatric/Behavioral: Negative.     Per HPI unless specifically indicated above     Objective:    BP (!) 165/72   Pulse 60   Temp 97.6 F (36.4 C) (Oral)   Ht 4\' 10"  (1.473 m)   Wt 129 lb (58.5 kg)   LMP  (LMP Unknown)   SpO2 96%   BMI 26.96 kg/m   Wt Readings from Last 3 Encounters:  05/21/19  129 lb (58.5 kg)  09/24/18 130 lb (59 kg)  09/24/18 130 lb (59 kg)    Physical Exam Constitutional:      General: She is not in acute distress.    Appearance: Normal appearance. She is well-developed.  HENT:     Head: Normocephalic and atraumatic.  Eyes:     General: Lids are normal. No scleral icterus.       Right eye: No discharge.        Left eye: No discharge.     Conjunctiva/sclera: Conjunctivae normal.  Neck:     Vascular: No carotid bruit or JVD.  Cardiovascular:     Rate and Rhythm: Normal rate and regular rhythm.     Heart sounds: Normal heart sounds.  Pulmonary:     Effort: Pulmonary effort is normal.     Breath sounds: Normal breath sounds.  Abdominal:     Palpations: There is no hepatomegaly or splenomegaly.  Musculoskeletal:        General: Normal range of motion.     Cervical back: Normal range of motion and neck supple.  Skin:  General: Skin is warm and dry.     Coloration: Skin is not pale.     Findings: No rash.  Neurological:     Mental Status: She is alert and oriented to person, place, and time.  Psychiatric:        Behavior: Behavior normal.        Thought Content: Thought content normal.        Judgment: Judgment normal.     Results for orders placed or performed in visit on 11/28/18  CBC with Differential/Platelet  Result Value Ref Range   WBC 4.9 3.4 - 10.8 x10E3/uL   RBC 4.62 3.77 - 5.28 x10E6/uL   Hemoglobin 13.6 11.1 - 15.9 g/dL   Hematocrit 35.5 73.2 - 46.6 %   MCV 88 79 - 97 fL   MCH 29.4 26.6 - 33.0 pg   MCHC 33.3 31.5 - 35.7 g/dL   RDW 20.2 54.2 - 70.6 %   Platelets 267 150 - 450 x10E3/uL   Neutrophils 56 Not Estab. %   Lymphs 34 Not Estab. %   Monocytes 5 Not Estab. %   Eos 4 Not Estab. %   Basos 1 Not Estab. %   Neutrophils Absolute 2.7 1.4 - 7.0 x10E3/uL   Lymphocytes Absolute 1.7 0.7 - 3.1 x10E3/uL   Monocytes Absolute 0.3 0.1 - 0.9 x10E3/uL   EOS (ABSOLUTE) 0.2 0.0 - 0.4 x10E3/uL   Basophils Absolute 0.0 0.0 - 0.2  x10E3/uL   Immature Granulocytes 0 Not Estab. %   Immature Grans (Abs) 0.0 0.0 - 0.1 x10E3/uL  VITAMIN D 25 Hydroxy (Vit-D Deficiency, Fractures)  Result Value Ref Range   Vit D, 25-Hydroxy 38.3 30.0 - 100.0 ng/mL  Basic Metabolic Panel (BMET)  Result Value Ref Range   Glucose 94 65 - 99 mg/dL   BUN 20 8 - 27 mg/dL   Creatinine, Ser 2.37 0.57 - 1.00 mg/dL   GFR calc non Af Amer 67 >59 mL/min/1.73   GFR calc Af Amer 77 >59 mL/min/1.73   BUN/Creatinine Ratio 24 12 - 28   Sodium 140 134 - 144 mmol/L   Potassium 4.1 3.5 - 5.2 mmol/L   Chloride 102 96 - 106 mmol/L   CO2 25 20 - 29 mmol/L   Calcium 9.9 8.7 - 10.3 mg/dL      Assessment & Plan:   Problem List Items Addressed This Visit      Unprioritized   Essential hypertension, benign    White coat elevations.  Good numbers at home.  Continue present treatment      Relevant Medications   amLODipine (NORVASC) 10 MG tablet   hydrochlorothiazide (MICROZIDE) 12.5 MG capsule   metoprolol succinate (TOPROL-XL) 50 MG 24 hr tablet   Other Relevant Orders   Comprehensive metabolic panel   Lipid Panel w/o Chol/HDL Ratio out   TSH   History of anemia    Continues to take iron supplements.  Full work-up several years ago      Relevant Orders   CBC with Differential/Platelet out   Osteopenia    Bone density 2.0. Repeat next year as qualifies for screening every 2 years.  Taking Evista          Follow up plan: Return in about 6 months (around 11/19/2019).

## 2019-05-21 NOTE — Assessment & Plan Note (Signed)
White coat elevations.  Good numbers at home.  Continue present treatment

## 2019-05-21 NOTE — Assessment & Plan Note (Addendum)
Bone density 2.0. Repeat next year as qualifies for screening every 2 years.  Taking Evista

## 2019-05-21 NOTE — Assessment & Plan Note (Signed)
Continues to take iron supplements.  Full work-up several years ago

## 2019-05-22 LAB — CBC WITH DIFFERENTIAL/PLATELET
Basophils Absolute: 0.1 10*3/uL (ref 0.0–0.2)
Basos: 1 %
EOS (ABSOLUTE): 0.2 10*3/uL (ref 0.0–0.4)
Eos: 3 %
Hematocrit: 43.2 % (ref 34.0–46.6)
Hemoglobin: 14.4 g/dL (ref 11.1–15.9)
Immature Grans (Abs): 0 10*3/uL (ref 0.0–0.1)
Immature Granulocytes: 0 %
Lymphocytes Absolute: 1.6 10*3/uL (ref 0.7–3.1)
Lymphs: 28 %
MCH: 29.9 pg (ref 26.6–33.0)
MCHC: 33.3 g/dL (ref 31.5–35.7)
MCV: 90 fL (ref 79–97)
Monocytes Absolute: 0.2 10*3/uL (ref 0.1–0.9)
Monocytes: 4 %
Neutrophils Absolute: 3.7 10*3/uL (ref 1.4–7.0)
Neutrophils: 64 %
Platelets: 319 10*3/uL (ref 150–450)
RBC: 4.81 x10E6/uL (ref 3.77–5.28)
RDW: 11.7 % (ref 11.7–15.4)
WBC: 5.8 10*3/uL (ref 3.4–10.8)

## 2019-05-22 LAB — COMPREHENSIVE METABOLIC PANEL
ALT: 13 IU/L (ref 0–32)
AST: 21 IU/L (ref 0–40)
Albumin/Globulin Ratio: 1.9 (ref 1.2–2.2)
Albumin: 4.5 g/dL (ref 3.6–4.6)
Alkaline Phosphatase: 70 IU/L (ref 39–117)
BUN/Creatinine Ratio: 22 (ref 12–28)
BUN: 16 mg/dL (ref 8–27)
Bilirubin Total: 0.3 mg/dL (ref 0.0–1.2)
CO2: 25 mmol/L (ref 20–29)
Calcium: 9.9 mg/dL (ref 8.7–10.3)
Chloride: 98 mmol/L (ref 96–106)
Creatinine, Ser: 0.73 mg/dL (ref 0.57–1.00)
GFR calc Af Amer: 89 mL/min/{1.73_m2} (ref >59)
GFR calc non Af Amer: 77 mL/min/{1.73_m2} (ref >59)
Globulin, Total: 2.4 g/dL (ref 1.5–4.5)
Glucose: 107 mg/dL — ABNORMAL HIGH (ref 65–99)
Potassium: 4.5 mmol/L (ref 3.5–5.2)
Sodium: 140 mmol/L (ref 134–144)
Total Protein: 6.9 g/dL (ref 6.0–8.5)

## 2019-05-22 LAB — LIPID PANEL W/O CHOL/HDL RATIO
Cholesterol, Total: 246 mg/dL — ABNORMAL HIGH (ref 100–199)
HDL: 65 mg/dL (ref >39)
LDL Chol Calc (NIH): 143 mg/dL — ABNORMAL HIGH (ref 0–99)
Triglycerides: 215 mg/dL — ABNORMAL HIGH (ref 0–149)
VLDL Cholesterol Cal: 38 mg/dL (ref 5–40)

## 2019-05-22 LAB — TSH: TSH: 1.53 u[IU]/mL (ref 0.450–4.500)

## 2019-05-26 ENCOUNTER — Encounter: Payer: Self-pay | Admitting: Unknown Physician Specialty

## 2019-07-02 ENCOUNTER — Telehealth: Payer: Self-pay | Admitting: Nurse Practitioner

## 2019-07-02 NOTE — Telephone Encounter (Signed)
Pt called and updated mailing address. Please advise.

## 2019-07-02 NOTE — Telephone Encounter (Signed)
Called pt to verify mailing address due to return mail.no answer left vm

## 2019-07-02 NOTE — Telephone Encounter (Signed)
Lab letter came back

## 2019-07-15 ENCOUNTER — Other Ambulatory Visit: Payer: Self-pay | Admitting: Nurse Practitioner

## 2019-07-15 NOTE — Telephone Encounter (Signed)
Copied from CRM [id number]. Topic: Quick Communication - Rx Refill/Question >> Jul 15, 2019 10:27 AM Dalphine Handing A wrote: Medication: metoprolol succinate (TOPROL-XL) 50 MG 24 hr tablet,raloxifene (EVISTA) 60 MG tablet,hydrochlorothiazide (MICROZIDE) 12.5 MG capsule   Has the patient contacted their pharmacy? Yes (Agent: If no, request that the patient contact the pharmacy for the refill.) (Agent: If yes, when and what did the pharmacy advise?)Contact PCP  Preferred Pharmacy (with phone number or street name): [id number] [place], [place] - [address] AT [facility] [address] [facility]  Phone:  [phone] Fax:  [phone]     Agent: Please be advised that RX refills may take up to 3 business days. We ask that you follow-up with your pharmacy.

## 2019-10-07 ENCOUNTER — Ambulatory Visit (INDEPENDENT_AMBULATORY_CARE_PROVIDER_SITE_OTHER): Payer: Medicare PPO

## 2019-10-07 VITALS — BP 128/68 | Wt 129.0 lb

## 2019-10-07 DIAGNOSIS — Z0000 Encounter for general adult medical examination without abnormal findings: Secondary | ICD-10-CM

## 2019-10-07 NOTE — Progress Notes (Signed)
Subjective:   Judith Hickman is a 86 y.o. female who presents for Medicare Annual (Subsequent) preventive examination.  This visit is being conducted via phone call  - after an attmept to do on video chat - due to the COVID-19 pandemic. This patient has given me verbal consent via phone to conduct this visit, patient states they are participating from their home address. Some vital signs may be absent or patient reported.   Patient identification: identified by name, DOB, and current address.    Review of Systems:         Objective:     Vitals: BP 128/68   Wt 129 lb (58.5 kg)   LMP  (LMP Unknown)   BMI 26.96 kg/m   Body mass index is 26.96 kg/m.  Advanced Directives 09/24/2018 09/18/2017 08/21/2016 07/27/2015 07/27/2015  Does Patient Have a Medical Advance Directive? Yes Yes No No No  Type of Estate agent of [place];Living will Healthcare Power of Attorney - - -  Copy of Healthcare Power of Attorney in Chart? Yes - validated most recent copy scanned in chart (See row information) Yes - - -  Would patient like information on creating a medical advance directive? - - No - Patient declined Yes - Educational materials given -    Tobacco Social History   Tobacco Use  Smoking Status Never Smoker  Smokeless Tobacco Never Used     Counseling given: Not Answered   Clinical Intake:  Pre-visit preparation completed: Yes  Pain : No/denies pain     Nutritional Risks: None Diabetes: No  How often do you need to have someone help you when you read instructions, pamphlets, or other written materials from your doctor or pharmacy?: 1 - Never  Interpreter Needed?: No  Information entered by :: Maveryk Renstrom,LPN  Past Medical History:  Diagnosis Date  . Back pain   . Chronic kidney disease   . Costalchondritis   . Hyperlipidemia   . Hypertension   . Hypertensive CKD (chronic kidney disease)   . Iron deficiency anemia   . Mitral valve regurgitation     . Osteoporosis   . Rotator cuff syndrome of left shoulder    Past Surgical History:  Procedure Laterality Date  . ABDOMINAL HYSTERECTOMY    . BLADDER REPAIR     X 2  . CARPAL TUNNEL RELEASE     Family History  Problem Relation Age of Onset  . Diabetes Mother   . Heart disease Father    Social History   Socioeconomic History  . Marital status: Divorced    Spouse name: Not on file  . Number of children: Not on file  . Years of education: Not on file  . Highest education level: 9th grade  Occupational History  . Not on file  Tobacco Use  . Smoking status: Never Smoker  . Smokeless tobacco: Never Used  Substance and Sexual Activity  . Alcohol use: No    Alcohol/week: 0.0 standard drinks  . Drug use: No  . Sexual activity: Not Currently  Other Topics Concern  . Not on file  Social History Narrative  . Not on file   Social Determinants of Health   Financial Resource Strain:   . Difficulty of Paying Living Expenses:   Food Insecurity:   . Worried About Programme researcher, broadcasting/film/video in the Last Year:   . Barista in the Last Year:   Transportation Needs:   . Freight forwarder (Medical):   Marland Kitchen  Lack of Transportation (Non-Medical):   Physical Activity:   . Days of Exercise per Week:   . Minutes of Exercise per Session:   Stress:   . Feeling of Stress :   Social Connections:   . Frequency of Communication with Friends and Family:   . Frequency of Social Gatherings with Friends and Family:   . Attends Religious Services:   . Active Member of Clubs or Organizations:   . Attends Archivist Meetings:   Marland Kitchen Marital Status:     Outpatient Encounter Medications as of 10/07/2019  Medication Sig  . amLODipine (NORVASC) 10 MG tablet Take 1 tablet (10 mg total) by mouth daily.  . hydrochlorothiazide (MICROZIDE) 12.5 MG capsule TAKE 1 CAPSULE BY MOUTH ONCE DAILY *STOP  TRIAMTERENE  /  HCTZ*  . IRON PO Take by mouth.   . metoprolol succinate (TOPROL-XL) 50 MG 24 hr  tablet TAKE 1 TABLET BY MOUTH ONCE DAILY TAKE  WITH  OR  IMMEDIATELY  FOLLOWING  A  MEAL  . raloxifene (EVISTA) 60 MG tablet Take 1 tablet (60 mg total) by mouth daily.  Marland Kitchen VITAMIN D, CHOLECALCIFEROL, PO Take by mouth.    No facility-administered encounter medications on file as of 10/07/2019.    Activities of Daily Living No flowsheet data found.  Patient Care Team: Venita Lick, NP as PCP - General (Nurse Practitioner)    Assessment:   This is a routine wellness examination for Judith Hickman.  Exercise Activities and Dietary recommendations    Goals Addressed   None     Fall Risk: Fall Risk  10/07/2019 05/21/2019 09/24/2018 09/18/2017 08/21/2016  Falls in the past year? 0 0 0 No No  Number falls in past yr: 0 0 - - -  Injury with Fall? 0 0 - - -    FALL RISK PREVENTION PERTAINING TO THE HOME:  Any stairs in or around the home? No  If so, are there any without handrails? no  Home free of loose throw rugs in walkways, pet beds, electrical cords, etc? Yes  Adequate lighting in your home to reduce risk of falls? Yes   ASSISTIVE DEVICES UTILIZED TO PREVENT FALLS:  Life alert? no Use of a cane, walker or w/c? No  Grab bars in the bathroom? No  Shower chair or bench in shower? No  Elevated toilet seat or a handicapped toilet? No   DME ORDERS:  DME order needed?  No   TIMED UP AND GO:   unable to perform    Depression Screen PHQ 2/9 Scores 05/21/2019 09/24/2018 09/18/2017 08/21/2016  PHQ - 2 Score 0 0 0 0  PHQ- 9 Score - - - 0     Cognitive Function     6CIT Screen 09/24/2018 09/18/2017  What Year? 0 points 0 points  What month? 0 points 0 points  What time? 0 points 0 points  Count back from 20 0 points 0 points  Months in reverse 0 points 0 points  Repeat phrase 0 points 0 points  Total Score 0 0    Immunization History  Administered Date(s) Administered  . Influenza, High Dose Seasonal PF 02/27/2017, 03/25/2018, 05/14/2019  . Influenza-Unspecified  03/09/2014, 06/07/2015, 04/09/2016  . PFIZER SARS-COV-2 Vaccination 06/16/2019, 07/07/2019  . Pneumococcal Conjugate-13 07/27/2015  . Pneumococcal Polysaccharide-23 03/01/2008  . Zoster 03/01/2008    Qualifies for Shingles Vaccine? Yes, shingrix discussed   Tdap: Discussed need for TD/TDAP vaccine, patient verbalized understanding that this is not covered as a preventative  with there insurance and to call the office if she develops any new skin injuries, ie: cuts, scrapes, bug bites, or open wounds.  Flu Vaccine: up to date  Pneumococcal Vaccine: up to date  Covid-19 Vaccine: Completed vaccines  Screening Tests Health Maintenance  Topic Date Due  . TETANUS/TDAP  03/01/2018  . INFLUENZA VACCINE  01/03/2020  . DEXA SCAN  Completed  . COVID-19 Vaccine  Completed  . PNA vac Low Risk Adult  Completed    Cancer Screenings:  Colorectal Screening: no longer required  Mammogram: no longer required  Bone Density: no longer required   Lung Cancer Screening: (Low Dose CT Chest recommended if Age 47-80 years, 30 pack-year currently smoking OR have quit w/in 15years.) does not qualify.     Additional Screening:  Hepatitis C Screening: does not qualify  Vision Screening: Recommended annual ophthalmology exams for early detection of glaucoma and other disorders of the eye. Is the patient up to date with their annual eye exam?  Yes    Dental Screening: Recommended annual dental exams for proper oral hygiene  Community Resource Referral:  CRR required this visit?  No       Plan:  I have personally reviewed and addressed the Medicare Annual Wellness questionnaire and have noted the following in the patient's chart:  A. Medical and social history B. Use of alcohol, tobacco or illicit drugs  C. Current medications and supplements D. Functional ability and status E.  Nutritional status F.  Physical activity G. Advance directives H. List of other physicians I.    Hospitalizations, surgeries, and ER visits in previous 12 months J.  Vitals K. Screenings such as hearing and vision if needed, cognitive and depression L. Referrals and appointments   In addition, I have reviewed and discussed with patient certain preventive protocols, quality metrics, and best practice recommendations. A written personalized care plan for preventive services as well as general preventive health recommendations were provided to patient.  Signed,    Collene Schlichter, LPN  09/03/7406 Nurse Health Advisor   Nurse Notes: none

## 2019-10-07 NOTE — Patient Instructions (Signed)
Judith Hickman , Thank you for taking time to come for your Medicare Wellness Visit. I appreciate your ongoing commitment to your health goals. Please review the following plan we discussed and let me know if I can assist you in the future.   Screening recommendations/referrals: Colonoscopy: no longer required  Mammogram: no longer required Bone Density: no longer required Recommended yearly ophthalmology/optometry visit for glaucoma screening and checkup Recommended yearly dental visit for hygiene and checkup  Vaccinations: Influenza vaccine: up to date Pneumococcal vaccine: up to date Tdap vaccine: due now Shingles vaccine: shingrix eligible   Covid-19: coompleted  Advanced directives: copy on file   Conditions/risks identified: none  Next appointment: Follow up in one year for your annual wellness visit    Preventive Care 65 Years and Older, Female Preventive care refers to lifestyle choices and visits with your health care provider that can promote health and wellness. What does preventive care include?  A yearly physical exam. This is also called an annual well check.  Dental exams once or twice a year.  Routine eye exams. Ask your health care provider how often you should have your eyes checked.  Personal lifestyle choices, including:  Daily care of your teeth and gums.  Regular physical activity.  Eating a healthy diet.  Avoiding tobacco and drug use.  Limiting alcohol use.  Practicing safe sex.  Taking low-dose aspirin every day.  Taking vitamin and mineral supplements as recommended by your health care provider. What happens during an annual well check? The services and screenings done by your health care provider during your annual well check will depend on your age, overall health, lifestyle risk factors, and family history of disease. Counseling  Your health care provider may ask you questions about your:  Alcohol use.  Tobacco use.  Drug  use.  Emotional well-being.  Home and relationship well-being.  Sexual activity.  Eating habits.  History of falls.  Memory and ability to understand (cognition).  Work and work Astronomer.  Reproductive health. Screening  You may have the following tests or measurements:  Height, weight, and BMI.  Blood pressure.  Lipid and cholesterol levels. These may be checked every 5 years, or more frequently if you are over 73 years old.  Skin check.  Lung cancer screening. You may have this screening every year starting at age 89 if you have a 30-pack-year history of smoking and currently smoke or have quit within the past 15 years.  Fecal occult blood test (FOBT) of the stool. You may have this test every year starting at age 104.  Flexible sigmoidoscopy or colonoscopy. You may have a sigmoidoscopy every 5 years or a colonoscopy every 10 years starting at age 18.  Hepatitis C blood test.  Hepatitis B blood test.  Sexually transmitted disease (STD) testing.  Diabetes screening. This is done by checking your blood sugar (glucose) after you have not eaten for a while (fasting). You may have this done every 1-3 years.  Bone density scan. This is done to screen for osteoporosis. You may have this done starting at age 72.  Mammogram. This may be done every 1-2 years. Talk to your health care provider about how often you should have regular mammograms. Talk with your health care provider about your test results, treatment options, and if necessary, the need for more tests. Vaccines  Your health care provider may recommend certain vaccines, such as:  Influenza vaccine. This is recommended every year.  Tetanus, diphtheria, and acellular pertussis (Tdap,  Td) vaccine. You may need a Td booster every 10 years.  Zoster vaccine. You may need this after age 50.  Pneumococcal 13-valent conjugate (PCV13) vaccine. One dose is recommended after age 66.  Pneumococcal polysaccharide  (PPSV23) vaccine. One dose is recommended after age 37. Talk to your health care provider about which screenings and vaccines you need and how often you need them. This information is not intended to replace advice given to you by your health care provider. Make sure you discuss any questions you have with your health care provider. Document Released: 06/17/2015 Document Revised: 02/08/2016 Document Reviewed: 03/22/2015 Elsevier Interactive Patient Education  2017 [place] Prevention in the Home Falls can cause injuries. They can happen to people of all ages. There are many things you can do to make your home safe and to help prevent falls. What can I do on the outside of my home?  Regularly fix the edges of walkways and driveways and fix any cracks.  Remove anything that might make you trip as you walk through a door, such as a raised step or threshold.  Trim any bushes or trees on the path to your home.  Use bright outdoor lighting.  Clear any walking paths of anything that might make someone trip, such as rocks or tools.  Regularly check to see if handrails are loose or broken. Make sure that both sides of any steps have handrails.  Any raised decks and porches should have guardrails on the edges.  Have any leaves, snow, or ice cleared regularly.  Use sand or salt on walking paths during winter.  Clean up any spills in your garage right away. This includes oil or grease spills. What can I do in the bathroom?  Use night lights.  Install grab bars by the toilet and in the tub and shower. Do not use towel bars as grab bars.  Use non-skid mats or decals in the tub or shower.  If you need to sit down in the shower, use a plastic, non-slip stool.  Keep the floor dry. Clean up any water that spills on the floor as soon as it happens.  Remove soap buildup in the tub or shower regularly.  Attach bath mats securely with double-sided non-slip rug tape.  Do not have  throw rugs and other things on the floor that can make you trip. What can I do in the bedroom?  Use night lights.  Make sure that you have a light by your bed that is easy to reach.  Do not use any sheets or blankets that are too big for your bed. They should not hang down onto the floor.  Have a firm chair that has side arms. You can use this for support while you get dressed.  Do not have throw rugs and other things on the floor that can make you trip. What can I do in the kitchen?  Clean up any spills right away.  Avoid walking on wet floors.  Keep items that you use a lot in easy-to-reach places.  If you need to reach something above you, use a strong step stool that has a grab bar.  Keep electrical cords out of the way.  Do not use floor polish or wax that makes floors slippery. If you must use wax, use non-skid floor wax.  Do not have throw rugs and other things on the floor that can make you trip. What can I do with my stairs?  Do not  leave any items on the stairs.  Make sure that there are handrails on both sides of the stairs and use them. Fix handrails that are broken or loose. Make sure that handrails are as long as the stairways.  Check any carpeting to make sure that it is firmly attached to the stairs. Fix any carpet that is loose or worn.  Avoid having throw rugs at the top or bottom of the stairs. If you do have throw rugs, attach them to the floor with carpet tape.  Make sure that you have a light switch at the top of the stairs and the bottom of the stairs. If you do not have them, ask someone to add them for you. What else can I do to help prevent falls?  Wear shoes that:  Do not have high heels.  Have rubber bottoms.  Are comfortable and fit you well.  Are closed at the toe. Do not wear sandals.  If you use a stepladder:  Make sure that it is fully opened. Do not climb a closed stepladder.  Make sure that both sides of the stepladder are  locked into place.  Ask someone to hold it for you, if possible.  Clearly mark and make sure that you can see:  Any grab bars or handrails.  First and last steps.  Where the edge of each step is.  Use tools that help you move around (mobility aids) if they are needed. These include:  Canes.  Walkers.  Scooters.  Crutches.  Turn on the lights when you go into a dark area. Replace any light bulbs as soon as they burn out.  Set up your furniture so you have a clear path. Avoid moving your furniture around.  If any of your floors are uneven, fix them.  If there are any pets around you, be aware of where they are.  Review your medicines with your doctor. Some medicines can make you feel dizzy. This can increase your chance of falling. Ask your doctor what other things that you can do to help prevent falls. This information is not intended to replace advice given to you by your health care provider. Make sure you discuss any questions you have with your health care provider. Document Released: 03/17/2009 Document Revised: 10/27/2015 Document Reviewed: 06/25/2014 Elsevier Interactive Patient Education  2017 [employer].

## 2019-11-24 ENCOUNTER — Ambulatory Visit: Payer: Medicare PPO | Admitting: Nurse Practitioner

## 2019-11-24 ENCOUNTER — Encounter: Payer: Self-pay | Admitting: Nurse Practitioner

## 2019-11-24 ENCOUNTER — Other Ambulatory Visit: Payer: Self-pay

## 2019-11-24 VITALS — BP 133/72 | HR 53 | Temp 98.6°F | Wt 126.0 lb

## 2019-11-24 DIAGNOSIS — E782 Mixed hyperlipidemia: Secondary | ICD-10-CM | POA: Diagnosis not present

## 2019-11-24 DIAGNOSIS — I10 Essential (primary) hypertension: Secondary | ICD-10-CM | POA: Diagnosis not present

## 2019-11-24 NOTE — Assessment & Plan Note (Signed)
Chronic, no current statin.  Refuses to initiate, which is appropriate at this time based on age.  Lipid panel today and continue diet focus at home. 

## 2019-11-24 NOTE — Patient Instructions (Signed)
DASH Eating Plan DASH stands for "Dietary Approaches to Stop Hypertension." The DASH eating plan is a healthy eating plan that has been shown to reduce high blood pressure (hypertension). It may also reduce your risk for type 2 diabetes, heart disease, and stroke. The DASH eating plan may also help with weight loss. What are tips for following this plan?  General guidelines  Avoid eating more than 2,300 mg (milligrams) of salt (sodium) a day. If you have hypertension, you may need to reduce your sodium intake to 1,500 mg a day.  Limit alcohol intake to no more than 1 drink a day for nonpregnant women and 2 drinks a day for men. One drink equals 12 oz of beer, 5 oz of wine, or 1 oz of hard liquor.  Work with your health care provider to maintain a healthy body weight or to lose weight. Ask what an ideal weight is for you.  Get at least 30 minutes of exercise that causes your heart to beat faster (aerobic exercise) most days of the week. Activities may include walking, swimming, or biking.  Work with your health care provider or diet and nutrition specialist (dietitian) to adjust your eating plan to your individual calorie needs. Reading food labels   Check food labels for the amount of sodium per serving. Choose foods with less than 5 percent of the Daily Value of sodium. Generally, foods with less than 300 mg of sodium per serving fit into this eating plan.  To find whole grains, look for the word "whole" as the first word in the ingredient list. Shopping  Buy products labeled as "low-sodium" or "no salt added."  Buy fresh foods. Avoid canned foods and premade or frozen meals. Cooking  Avoid adding salt when cooking. Use salt-free seasonings or herbs instead of table salt or sea salt. Check with your health care provider or pharmacist before using salt substitutes.  Do not fry foods. Cook foods using healthy methods such as baking, boiling, grilling, and broiling instead.  Cook with  heart-healthy oils, such as olive, canola, soybean, or sunflower oil. Meal planning  Eat a balanced diet that includes: ? 5 or more servings of fruits and vegetables each day. At each meal, try to fill half of your plate with fruits and vegetables. ? Up to 6-8 servings of whole grains each day. ? Less than 6 oz of lean meat, poultry, or fish each day. A 3-oz serving of meat is about the same size as a deck of cards. One egg equals 1 oz. ? 2 servings of low-fat dairy each day. ? A serving of nuts, seeds, or beans 5 times each week. ? Heart-healthy fats. Healthy fats called Omega-3 fatty acids are found in foods such as flaxseeds and coldwater fish, like sardines, salmon, and mackerel.  Limit how much you eat of the following: ? Canned or prepackaged foods. ? Food that is high in trans fat, such as fried foods. ? Food that is high in saturated fat, such as fatty meat. ? Sweets, desserts, sugary drinks, and other foods with added sugar. ? Full-fat dairy products.  Do not salt foods before eating.  Try to eat at least 2 vegetarian meals each week.  Eat more home-cooked food and less restaurant, buffet, and fast food.  When eating at a restaurant, ask that your food be prepared with less salt or no salt, if possible. What foods are recommended? The items listed may not be a complete list. Talk with your dietitian about   what dietary choices are best for you. Grains Whole-grain or whole-wheat bread. Whole-grain or whole-wheat pasta. Brown rice. Oatmeal. Quinoa. Bulgur. Whole-grain and low-sodium cereals. Pita bread. Low-fat, low-sodium crackers. Whole-wheat flour tortillas. Vegetables Fresh or frozen vegetables (raw, steamed, roasted, or grilled). Low-sodium or reduced-sodium tomato and vegetable juice. Low-sodium or reduced-sodium tomato sauce and tomato paste. Low-sodium or reduced-sodium canned vegetables. Fruits All fresh, dried, or frozen fruit. Canned fruit in natural juice (without  added sugar). Meat and other protein foods Skinless chicken or turkey. Ground chicken or turkey. Pork with fat trimmed off. Fish and seafood. Egg whites. Dried beans, peas, or lentils. Unsalted nuts, nut butters, and seeds. Unsalted canned beans. Lean cuts of beef with fat trimmed off. Low-sodium, lean deli meat. Dairy Low-fat (1%) or fat-free (skim) milk. Fat-free, low-fat, or reduced-fat cheeses. Nonfat, low-sodium ricotta or cottage cheese. Low-fat or nonfat yogurt. Low-fat, low-sodium cheese. Fats and oils Soft margarine without trans fats. Vegetable oil. Low-fat, reduced-fat, or light mayonnaise and salad dressings (reduced-sodium). Canola, safflower, olive, soybean, and sunflower oils. Avocado. Seasoning and other foods Herbs. Spices. Seasoning mixes without salt. Unsalted popcorn and pretzels. Fat-free sweets. What foods are not recommended? The items listed may not be a complete list. Talk with your dietitian about what dietary choices are best for you. Grains Baked goods made with fat, such as croissants, muffins, or some breads. Dry pasta or rice meal packs. Vegetables Creamed or fried vegetables. Vegetables in a cheese sauce. Regular canned vegetables (not low-sodium or reduced-sodium). Regular canned tomato sauce and paste (not low-sodium or reduced-sodium). Regular tomato and vegetable juice (not low-sodium or reduced-sodium). Pickles. Olives. Fruits Canned fruit in a light or heavy syrup. Fried fruit. Fruit in cream or butter sauce. Meat and other protein foods Fatty cuts of meat. Ribs. Fried meat. Bacon. Sausage. Bologna and other processed lunch meats. Salami. Fatback. Hotdogs. Bratwurst. Salted nuts and seeds. Canned beans with added salt. Canned or smoked fish. Whole eggs or egg yolks. Chicken or turkey with skin. Dairy Whole or 2% milk, cream, and half-and-half. Whole or full-fat cream cheese. Whole-fat or sweetened yogurt. Full-fat cheese. Nondairy creamers. Whipped toppings.  Processed cheese and cheese spreads. Fats and oils Butter. Stick margarine. Lard. Shortening. Ghee. Bacon fat. Tropical oils, such as coconut, palm kernel, or palm oil. Seasoning and other foods Salted popcorn and pretzels. Onion salt, garlic salt, seasoned salt, table salt, and sea salt. Worcestershire sauce. Tartar sauce. Barbecue sauce. Teriyaki sauce. Soy sauce, including reduced-sodium. Steak sauce. Canned and packaged gravies. Fish sauce. Oyster sauce. Cocktail sauce. Horseradish that you find on the shelf. Ketchup. Mustard. Meat flavorings and tenderizers. Bouillon cubes. Hot sauce and Tabasco sauce. Premade or packaged marinades. Premade or packaged taco seasonings. Relishes. Regular salad dressings. Where to find more information:  National Heart, Lung, and Blood Institute: www.nhlbi.nih.gov  American Heart Association: www.heart.org Summary  The DASH eating plan is a healthy eating plan that has been shown to reduce high blood pressure (hypertension). It may also reduce your risk for type 2 diabetes, heart disease, and stroke.  With the DASH eating plan, you should limit salt (sodium) intake to 2,300 mg a day. If you have hypertension, you may need to reduce your sodium intake to 1,500 mg a day.  When on the DASH eating plan, aim to eat more fresh fruits and vegetables, whole grains, lean proteins, low-fat dairy, and heart-healthy fats.  Work with your health care provider or diet and nutrition specialist (dietitian) to adjust your eating plan to your   individual calorie needs. This information is not intended to replace advice given to you by your health care provider. Make sure you discuss any questions you have with your health care provider. Document Revised: 05/03/2017 Document Reviewed: 05/14/2016 Elsevier Patient Education  2020 [employer] Inc.  

## 2019-11-24 NOTE — Progress Notes (Signed)
BP 133/72 (BP Location: Left Arm, Cuff Size: Normal)    Pulse (!) 53    Temp 98.6 F (37 C) (Oral)    Wt 126 lb (57.2 kg)    LMP  (LMP Unknown)    SpO2 94%    BMI 26.33 kg/m    Subjective:    Patient ID: Judith Hickman, female    DOB: [birth year], 86 y.o.   MRN: [id number]  HPI: Judith Hickman is a 86 y.o. female  Chief Complaint  Patient presents with   Hypertension   HYPERTENSION / HYPERLIPIDEMIA Continues on Metoprolol 50 MG daily, HCTZ 12.5 MG daily, and Amlodipine 10 MG daily.  No current statin.  Last labs LDL 143 and TCHOL 246.   Satisfied with current treatment? yes Duration of hypertension: chronic BP monitoring frequency: a few times a week BP range: 137/71 at home this morning --- averages 120-130/70 at home BP medication side effects: no Duration of hyperlipidemia: chronic Aspirin: no Recent stressors: no Recurrent headaches: no Visual changes: no Palpitations: no Dyspnea: no Chest pain: no Lower extremity edema: no Dizzy/lightheaded: no  The ASCVD Risk score Denman George DC Jr., et al., 2013) failed to calculate for the following reasons:   The 2013 ASCVD risk score is only valid for ages 68 to 9  Relevant past medical, surgical, family and social history reviewed and updated as indicated. Interim medical history since our last visit reviewed. Allergies and medications reviewed and updated.  Review of Systems  Constitutional: Negative for activity change, appetite change, diaphoresis, fatigue and fever.  Respiratory: Negative for cough, chest tightness and shortness of breath.   Cardiovascular: Negative for chest pain, palpitations and leg swelling.  Gastrointestinal: Negative.   Neurological: Negative.   Psychiatric/Behavioral: Negative.     Per HPI unless specifically indicated above     Objective:    BP 133/72 (BP Location: Left Arm, Cuff Size: Normal)    Pulse (!) 53    Temp 98.6 F (37 C) (Oral)    Wt 126 lb (57.2 kg)    LMP  (LMP Unknown)    SpO2  94%    BMI 26.33 kg/m   Wt Readings from Last 3 Encounters:  11/24/19 126 lb (57.2 kg)  10/07/19 129 lb (58.5 kg)  05/21/19 129 lb (58.5 kg)    Physical Exam Vitals and nursing note reviewed.  Constitutional:      General: She is awake. She is not in acute distress.    Appearance: She is well-developed and well-groomed. She is not ill-appearing.  HENT:     Head: Normocephalic.     Right Ear: Hearing normal.     Left Ear: Hearing normal.  Eyes:     General: Lids are normal.        Right eye: No discharge.        Left eye: No discharge.     Conjunctiva/sclera: Conjunctivae normal.     Pupils: Pupils are equal, round, and reactive to light.  Neck:     Thyroid: No thyromegaly.     Vascular: No carotid bruit.  Cardiovascular:     Rate and Rhythm: Normal rate and regular rhythm.     Heart sounds: Normal heart sounds. No murmur heard.  No gallop.   Pulmonary:     Effort: Pulmonary effort is normal. No accessory muscle usage or respiratory distress.     Breath sounds: Normal breath sounds.  Abdominal:     General: Bowel sounds are normal.  Palpations: Abdomen is soft.  Musculoskeletal:     Cervical back: Normal range of motion and neck supple.     Right lower leg: No edema.     Left lower leg: No edema.  Skin:    General: Skin is warm and dry.  Neurological:     Mental Status: She is alert and oriented to person, place, and time.  Psychiatric:        Attention and Perception: Attention normal.        Mood and Affect: Mood normal.        Speech: Speech normal.        Behavior: Behavior normal. Behavior is cooperative.        Thought Content: Thought content normal.    Results for orders placed or performed in visit on 05/21/19  CBC with Differential/Platelet out  Result Value Ref Range   WBC 5.8 3.4 - 10.8 x10E3/uL   RBC 4.81 3.77 - 5.28 x10E6/uL   Hemoglobin 14.4 11.1 - 15.9 g/dL   Hematocrit 43.2 34.0 - 46.6 %   MCV 90 79 - 97 fL   MCH 29.9 26.6 - 33.0 pg    MCHC 33.3 31 - 35 g/dL   RDW 11.7 11.7 - 15.4 %   Platelets 319 150 - 450 x10E3/uL   Neutrophils 64 Not Estab. %   Lymphs 28 Not Estab. %   Monocytes 4 Not Estab. %   Eos 3 Not Estab. %   Basos 1 Not Estab. %   Neutrophils Absolute 3.7 1 - 7 x10E3/uL   Lymphocytes Absolute 1.6 0 - 3 x10E3/uL   Monocytes Absolute 0.2 0 - 0 x10E3/uL   EOS (ABSOLUTE) 0.2 0.0 - 0.4 x10E3/uL   Basophils Absolute 0.1 0 - 0 x10E3/uL   Immature Granulocytes 0 Not Estab. %   Immature Grans (Abs) 0.0 0.0 - 0.1 x10E3/uL  Comprehensive metabolic panel  Result Value Ref Range   Glucose 107 (H) 65 - 99 mg/dL   BUN 16 8 - 27 mg/dL   Creatinine, Ser 0.73 0.57 - 1.00 mg/dL   GFR calc non Af Amer 77 >59 mL/min/1.73   GFR calc Af Amer 89 >59 mL/min/1.73   BUN/Creatinine Ratio 22 12 - 28   Sodium 140 134 - 144 mmol/L   Potassium 4.5 3.5 - 5.2 mmol/L   Chloride 98 96 - 106 mmol/L   CO2 25 20 - 29 mmol/L   Calcium 9.9 8.7 - 10.3 mg/dL   Total Protein 6.9 6.0 - 8.5 g/dL   Albumin 4.5 3.6 - 4.6 g/dL   Globulin, Total 2.4 1.5 - 4.5 g/dL   Albumin/Globulin Ratio 1.9 1.2 - 2.2   Bilirubin Total 0.3 0.0 - 1.2 mg/dL   Alkaline Phosphatase 70 39 - 117 IU/L   AST 21 0 - 40 IU/L   ALT 13 0 - 32 IU/L  Lipid Panel w/o Chol/HDL Ratio out  Result Value Ref Range   Cholesterol, Total 246 (H) 100 - 199 mg/dL   Triglycerides 215 (H) 0 - 149 mg/dL   HDL 65 >39 mg/dL   VLDL Cholesterol Cal 38 5 - 40 mg/dL   LDL Chol Calc (NIH) 143 (H) 0 - 99 mg/dL  TSH  Result Value Ref Range   TSH 1.530 0.450 - 4.500 uIU/mL      Assessment & Plan:   Problem List Items Addressed This Visit      Cardiovascular and Mediastinum   Essential hypertension, benign - Primary    Chronic,  stable with BP at goal for age in office and at home.  Continue current medication regimen and adjust as needed.  Recommend she notify provider if BP ever consistently runs lower at home or has dizzy spells, then may reduce medication.  Continue to monitor BP at  home and focus on DASH diet.  BMP today.  Return in 6 months for annual exam.      Relevant Orders   Basic metabolic panel     Other   Hyperlipidemia    Chronic, no current statin.  Refuses to initiate, which is appropriate at this time based on age.  Lipid panel today and continue diet focus at home.      Relevant Orders   Lipid Panel w/o Chol/HDL Ratio       Follow up plan: Return in about 6 months (around 05/25/2020) for Annual Exam.

## 2019-11-24 NOTE — Assessment & Plan Note (Signed)
Chronic, stable with BP at goal for age in office and at home.  Continue current medication regimen and adjust as needed.  Recommend she notify provider if BP ever consistently runs lower at home or has dizzy spells, then may reduce medication.  Continue to monitor BP at home and focus on DASH diet.  BMP today.  Return in 6 months for annual exam.

## 2019-11-25 LAB — LIPID PANEL W/O CHOL/HDL RATIO
Cholesterol, Total: 213 mg/dL — ABNORMAL HIGH (ref 100–199)
HDL: 57 mg/dL (ref >39)
LDL Chol Calc (NIH): 124 mg/dL — ABNORMAL HIGH (ref 0–99)
Triglycerides: 185 mg/dL — ABNORMAL HIGH (ref 0–149)
VLDL Cholesterol Cal: 32 mg/dL (ref 5–40)

## 2019-11-25 LAB — BASIC METABOLIC PANEL
BUN/Creatinine Ratio: 30 — ABNORMAL HIGH (ref 12–28)
BUN: 25 mg/dL (ref 8–27)
CO2: 24 mmol/L (ref 20–29)
Calcium: 9.6 mg/dL (ref 8.7–10.3)
Chloride: 105 mmol/L (ref 96–106)
Creatinine, Ser: 0.82 mg/dL (ref 0.57–1.00)
GFR calc Af Amer: 77 mL/min/{1.73_m2} (ref >59)
GFR calc non Af Amer: 66 mL/min/{1.73_m2} (ref >59)
Glucose: 97 mg/dL (ref 65–99)
Potassium: 4.4 mmol/L (ref 3.5–5.2)
Sodium: 141 mmol/L (ref 134–144)

## 2019-11-25 NOTE — Progress Notes (Signed)
Please let Judith Hickman know her labs have returned, kidney function and electrolytes remain stable.  Cholesterol levels continue to be elevated but improved from previous labs, at this time recommend focus on healthy diet and regular walking for exercise.  If any questions let me know.  Have a wonderful day!!

## 2020-05-09 ENCOUNTER — Other Ambulatory Visit: Payer: Self-pay | Admitting: Unknown Physician Specialty

## 2020-05-31 ENCOUNTER — Ambulatory Visit: Payer: Medicare PPO | Admitting: Nurse Practitioner

## 2020-06-21 ENCOUNTER — Ambulatory Visit: Payer: Medicare PPO | Admitting: Nurse Practitioner

## 2020-06-29 ENCOUNTER — Encounter: Payer: Self-pay | Admitting: Nurse Practitioner

## 2020-06-29 ENCOUNTER — Other Ambulatory Visit: Payer: Self-pay

## 2020-06-29 ENCOUNTER — Ambulatory Visit: Payer: Medicare PPO | Admitting: Nurse Practitioner

## 2020-06-29 VITALS — BP 128/69 | HR 55 | Temp 98.2°F | Ht 58.19 in | Wt 123.0 lb

## 2020-06-29 DIAGNOSIS — M8588 Other specified disorders of bone density and structure, other site: Secondary | ICD-10-CM

## 2020-06-29 DIAGNOSIS — I10 Essential (primary) hypertension: Secondary | ICD-10-CM | POA: Diagnosis not present

## 2020-06-29 DIAGNOSIS — Z23 Encounter for immunization: Secondary | ICD-10-CM | POA: Diagnosis not present

## 2020-06-29 DIAGNOSIS — E782 Mixed hyperlipidemia: Secondary | ICD-10-CM

## 2020-06-29 MED ORDER — HYDROCHLOROTHIAZIDE 12.5 MG PO CAPS: ORAL_CAPSULE | ORAL | 4 refills | Status: DC

## 2020-06-29 MED ORDER — METOPROLOL SUCCINATE ER 50 MG PO TB24: ORAL_TABLET | ORAL | 4 refills | Status: DC

## 2020-06-29 MED ORDER — RALOXIFENE HCL 60 MG PO TABS: 60.0000 mg | ORAL_TABLET | Freq: Every day | ORAL | 4 refills | Status: DC

## 2020-06-29 MED ORDER — AMLODIPINE BESYLATE 10 MG PO TABS: ORAL_TABLET | ORAL | 4 refills | Status: DC

## 2020-06-29 NOTE — Progress Notes (Signed)
BP 128/69   Pulse (!) 55   Temp 98.2 F (36.8 C) (Oral)   Ht 4' 10.19" (1.478 m)   Wt 123 lb (55.8 kg)   LMP  (LMP Unknown)   SpO2 98%   BMI 25.54 kg/m    Subjective:    Patient ID: Judith Hickman, female    DOB: 14-Jan-1934, 87 y.o.   MRN: [id number]  HPI: Judith Hickman is a 87 y.o. female  Chief Complaint  Patient presents with  . Hypertension    6 month follow up   HYPERTENSION / HYPERLIPIDEMIA Continues on Amlodipine and HCTZ.  She would like her Tetanus updated today, is not due until June.  Is aware insurance may not cover early, she states she "does not mind the bill".   Satisfied with current treatment? yes Duration of hypertension: chronic BP monitoring frequency: daily BP range: 120-130/70 range BP medication side effects: no Duration of hyperlipidemia: chronic Aspirin: no Recent stressors: no Recurrent headaches: no Visual changes: no Palpitations: no Dyspnea: no Chest pain: no Lower extremity edema: no Dizzy/lightheaded: no  The ASCVD Risk score Denman George DC Jr., et al., 2013) failed to calculate for the following reasons:   The 2013 ASCVD risk score is only valid for ages 71 to 86   OSTEOPENIA Last scan 01/01/18 noting osteopenia with T-score -2.0.  Continues Vit D and Evista. Satisfied with current treatment?: yes Medication side effects: no Medication compliance: good compliance Past osteoporosis medications/treatments: Evista Adequate calcium & vitamin D: yes Intolerance to bisphosphonates:no Weight bearing exercises: yes  Relevant past medical, surgical, family and social history reviewed and updated as indicated. Interim medical history since our last visit reviewed. Allergies and medications reviewed and updated.  Review of Systems  Constitutional: Negative for activity change, appetite change, diaphoresis, fatigue and fever.  Respiratory: Negative for cough, chest tightness and shortness of breath.   Cardiovascular: Negative for chest  pain, palpitations and leg swelling.  Gastrointestinal: Negative.   Neurological: Negative.   Psychiatric/Behavioral: Negative.     Per HPI unless specifically indicated above     Objective:    BP 128/69   Pulse (!) 55   Temp 98.2 F (36.8 C) (Oral)   Ht 4' 10.19" (1.478 m)   Wt 123 lb (55.8 kg)   LMP  (LMP Unknown)   SpO2 98%   BMI 25.54 kg/m   Wt Readings from Last 3 Encounters:  06/29/20 123 lb (55.8 kg)  11/24/19 126 lb (57.2 kg)  10/07/19 129 lb (58.5 kg)    Physical Exam Vitals and nursing note reviewed.  Constitutional:      General: She is awake. She is not in acute distress.    Appearance: She is well-developed and well-groomed. She is not ill-appearing.  HENT:     Head: Normocephalic.     Right Ear: Hearing normal.     Left Ear: Hearing normal.  Eyes:     General: Lids are normal.        Right eye: No discharge.        Left eye: No discharge.     Conjunctiva/sclera: Conjunctivae normal.     Pupils: Pupils are equal, round, and reactive to light.  Neck:     Thyroid: No thyromegaly.     Vascular: No carotid bruit.  Cardiovascular:     Rate and Rhythm: Normal rate and regular rhythm.     Heart sounds: Normal heart sounds. No murmur heard. No gallop.   Pulmonary:  Effort: Pulmonary effort is normal.     Breath sounds: Normal breath sounds.  Abdominal:     General: Bowel sounds are normal.     Palpations: Abdomen is soft.  Musculoskeletal:     Cervical back: Normal range of motion and neck supple.     Right lower leg: No edema.     Left lower leg: No edema.  Lymphadenopathy:     Cervical: No cervical adenopathy.  Skin:    General: Skin is warm and dry.  Neurological:     Mental Status: She is alert and oriented to person, place, and time.     Deep Tendon Reflexes: Reflexes are normal and symmetric.     Reflex Scores:      Brachioradialis reflexes are 2+ on the right side and 2+ on the left side.      Patellar reflexes are 2+ on the right side  and 2+ on the left side. Psychiatric:        Attention and Perception: Attention normal.        Mood and Affect: Mood normal.        Speech: Speech normal.        Behavior: Behavior normal. Behavior is cooperative.        Thought Content: Thought content normal.    Results for orders placed or performed in visit on 11/24/19  Basic metabolic panel  Result Value Ref Range   Glucose 97 65 - 99 mg/dL   BUN 25 8 - 27 mg/dL   Creatinine, Ser 0.97 0.57 - 1.00 mg/dL   GFR calc non Af Amer 66 >59 mL/min/1.73   GFR calc Af Amer 77 >59 mL/min/1.73   BUN/Creatinine Ratio 30 (H) 12 - 28   Sodium 141 134 - 144 mmol/L   Potassium 4.4 3.5 - 5.2 mmol/L   Chloride 105 96 - 106 mmol/L   CO2 24 20 - 29 mmol/L   Calcium 9.6 8.7 - 10.3 mg/dL  Lipid Panel w/o Chol/HDL Ratio  Result Value Ref Range   Cholesterol, Total 213 (H) 100 - 199 mg/dL   Triglycerides 353 (H) 0 - 149 mg/dL   HDL 57 >29 mg/dL   VLDL Cholesterol Cal 32 5 - 40 mg/dL   LDL Chol Calc (NIH) 924 (H) 0 - 99 mg/dL      Assessment & Plan:   Problem List Items Addressed This Visit      Cardiovascular and Mediastinum   Essential hypertension, benign - Primary    Chronic, stable with BP at goal for age in office and at home.  Continue current medication regimen and adjust as needed.  Recommend she notify provider if BP ever consistently runs lower at home or has dizzy spells, then may reduce medication.  Continue to monitor BP at home and focus on DASH diet.  BMP and TSH today.  Refills sent.  Return in 6 months.      Relevant Medications   amLODipine (NORVASC) 10 MG tablet   hydrochlorothiazide (MICROZIDE) 12.5 MG capsule   metoprolol succinate (TOPROL-XL) 50 MG 24 hr tablet   Other Relevant Orders   Basic metabolic panel   TSH     Musculoskeletal and Integument   Osteopenia    Ongoing.  Bone density -2.0.  Repeat in 2024.  Taking Evista, continue this and Vitamin D.  Recheck Vit D level today.      Relevant Orders    VITAMIN D 25 Hydroxy (Vit-D Deficiency, Fractures)     Other  Hyperlipidemia    Chronic, no current statin.  Refuses to initiate, which is appropriate at this time based on age.  Lipid panel today and continue diet focus at home.      Relevant Medications   amLODipine (NORVASC) 10 MG tablet   hydrochlorothiazide (MICROZIDE) 12.5 MG capsule   metoprolol succinate (TOPROL-XL) 50 MG 24 hr tablet   Other Relevant Orders   Lipid Panel w/o Chol/HDL Ratio       Follow up plan: Return in about 6 months (around 12/27/2020) for HTN/HLD and OSTEOPENIA.

## 2020-06-29 NOTE — Assessment & Plan Note (Signed)
Chronic, stable with BP at goal for age in office and at home.  Continue current medication regimen and adjust as needed.  Recommend she notify provider if BP ever consistently runs lower at home or has dizzy spells, then may reduce medication.  Continue to monitor BP at home and focus on DASH diet.  BMP and TSH today.  Refills sent.  Return in 6 months.

## 2020-06-29 NOTE — Assessment & Plan Note (Signed)
Chronic, no current statin.  Refuses to initiate, which is appropriate at this time based on age.  Lipid panel today and continue diet focus at home.

## 2020-06-29 NOTE — Assessment & Plan Note (Signed)
Ongoing.  Bone density -2.0.  Repeat in 2024.  Taking Evista, continue this and Vitamin D.  Recheck Vit D level today.

## 2020-06-29 NOTE — Patient Instructions (Signed)

## 2020-06-30 LAB — LIPID PANEL W/O CHOL/HDL RATIO
Cholesterol, Total: 255 mg/dL — ABNORMAL HIGH (ref 100–199)
HDL: 64 mg/dL (ref >39)
LDL Chol Calc (NIH): 166 mg/dL — ABNORMAL HIGH (ref 0–99)
Triglycerides: 142 mg/dL (ref 0–149)
VLDL Cholesterol Cal: 25 mg/dL (ref 5–40)

## 2020-06-30 LAB — BASIC METABOLIC PANEL
BUN/Creatinine Ratio: 25 (ref 12–28)
BUN: 22 mg/dL (ref 8–27)
CO2: 25 mmol/L (ref 20–29)
Calcium: 9.6 mg/dL (ref 8.7–10.3)
Chloride: 101 mmol/L (ref 96–106)
Creatinine, Ser: 0.88 mg/dL (ref 0.57–1.00)
GFR calc Af Amer: 70 mL/min/{1.73_m2} (ref >59)
GFR calc non Af Amer: 61 mL/min/{1.73_m2} (ref >59)
Glucose: 116 mg/dL — ABNORMAL HIGH (ref 65–99)
Potassium: 4.2 mmol/L (ref 3.5–5.2)
Sodium: 139 mmol/L (ref 134–144)

## 2020-06-30 LAB — TSH: TSH: 1.04 u[IU]/mL (ref 0.450–4.500)

## 2020-06-30 LAB — VITAMIN D 25 HYDROXY (VIT D DEFICIENCY, FRACTURES): Vit D, 25-Hydroxy: 38.6 ng/mL (ref 30.0–100.0)

## 2020-06-30 NOTE — Progress Notes (Signed)
Good afternoon, please let Delores know her labs have returned.  Glucose, sugar, was a little elevated but we will continue to monitor.  Kidney function is stable.  Thyroid and Vitamin D level are normal range.  Cholesterol levels remain elevated, but as we discussed yesterday we will not start statin at this time.  Continue focus on lowering high saturated fat foods and sugar in diet, eat more fish.  Any questions? Keep being awesome!!  Thank you for allowing me to participate in your care. Kindest regards, Maidie Streight

## 2020-08-11 ENCOUNTER — Other Ambulatory Visit: Payer: Self-pay | Admitting: Unknown Physician Specialty

## 2020-10-10 ENCOUNTER — Ambulatory Visit (INDEPENDENT_AMBULATORY_CARE_PROVIDER_SITE_OTHER): Payer: Medicare PPO

## 2020-10-10 VITALS — Ht <= 58 in | Wt 122.0 lb

## 2020-10-10 DIAGNOSIS — Z0000 Encounter for general adult medical examination without abnormal findings: Secondary | ICD-10-CM | POA: Diagnosis not present

## 2020-10-10 NOTE — Progress Notes (Signed)
I connected with Judith Hickman today by telephone and verified that I am speaking with the correct person using two identifiers. Location patient: home Location provider: work Persons participating in the virtual visit: Judith Hickman, Judith Ponderickeah Ashelyn Mccravy LPN.   I discussed the limitations, risks, security and privacy concerns of performing an evaluation and management service by telephone and the availability of in person appointments. I also discussed with the patient that there may be a patient responsible charge related to this service. The patient expressed understanding and verbally consented to this telephonic visit.    Interactive audio and video telecommunications were attempted between this provider and patient, however failed, due to patient having technical difficulties OR patient did not have access to video capability.  We continued and completed visit with audio only.     Vital signs may be patient reported or missing.  Subjective:   Judith Hickman is a 87 y.o. female who presents for Medicare Annual (Subsequent) preventive examination.  Review of Systems     Cardiac Risk Factors include: advanced age (>8655men, 64>65 women);dyslipidemia;hypertension     Objective:    Today's Vitals   10/10/20 1256  Weight: 122 lb (55.3 kg)  Height: 4\' 10"  (1.473 m)   Body mass index is 25.5 kg/m.  Advanced Directives 10/10/2020 09/24/2018 09/18/2017 08/21/2016 07/27/2015 07/27/2015  Does Patient Have a Medical Advance Directive? Yes Yes Yes No No No  Type of Estate agentAdvance Directive Healthcare Power of State Street Corporationttorney Healthcare Power of Del AireAttorney;Living will Healthcare Power of Attorney - - -  Copy of Healthcare Power of Attorney in Chart? - Yes - validated most recent copy scanned in chart (See row information) Yes - - -  Would patient like information on creating a medical advance directive? - - - No - Patient declined Yes - Educational materials given -    Current Medications (verified) Outpatient  Encounter Medications as of 10/10/2020  Medication Sig  . amLODipine (NORVASC) 10 MG tablet TAKE 1 TABLET(10 MG) BY MOUTH DAILY  . hydrochlorothiazide (MICROZIDE) 12.5 MG capsule TAKE 1 CAPSULE BY MOUTH ONCE DAILY.  . IRON PO Take by mouth.   . metoprolol succinate (TOPROL-XL) 50 MG 24 hr tablet TAKE 1 TABLET BY MOUTH ONCE DAILY TAKE  WITH  OR  IMMEDIATELY  FOLLOWING  A  MEAL  . raloxifene (EVISTA) 60 MG tablet Take 1 tablet (60 mg total) by mouth daily.  Marland Kitchen. VITAMIN D, CHOLECALCIFEROL, PO Take by mouth.    No facility-administered encounter medications on file as of 10/10/2020.    Allergies (verified) Lisinopril   History: Past Medical History:  Diagnosis Date  . Back pain   . Chronic kidney disease   . Costalchondritis   . Hyperlipidemia   . Hypertension   . Hypertensive CKD (chronic kidney disease)   . Iron deficiency anemia   . Mitral valve regurgitation   . Osteoporosis   . Rotator cuff syndrome of left shoulder    Past Surgical History:  Procedure Laterality Date  . ABDOMINAL HYSTERECTOMY    . BLADDER REPAIR     X 2  . CARPAL TUNNEL RELEASE     Family History  Problem Relation Age of Onset  . Diabetes Mother   . Heart disease Father    Social History   Socioeconomic History  . Marital status: Divorced    Spouse name: Not on file  . Number of children: Not on file  . Years of education: Not on file  . Highest education level: 9th grade  Occupational History  . Not on file  Tobacco Use  . Smoking status: Never Smoker  . Smokeless tobacco: Never Used  Vaping Use  . Vaping Use: Never used  Substance and Sexual Activity  . Alcohol use: No    Alcohol/week: 0.0 standard drinks  . Drug use: No  . Sexual activity: Not Currently  Other Topics Concern  . Not on file  Social History Narrative  . Not on file   Social Determinants of Health   Financial Resource Strain: Low Risk   . Difficulty of Paying Living Expenses: Not hard at all  Food Insecurity: No Food  Insecurity  . Worried About Programme researcher, broadcasting/film/video in the Last Year: Never true  . Ran Out of Food in the Last Year: Never true  Transportation Needs: No Transportation Needs  . Lack of Transportation (Medical): No  . Lack of Transportation (Non-Medical): No  Physical Activity: Insufficiently Active  . Days of Exercise per Week: 6 days  . Minutes of Exercise per Session: 20 min  Stress: No Stress Concern Present  . Feeling of Stress : Not at all  Social Connections: Not on file    Tobacco Counseling Counseling given: Not Answered   Clinical Intake:  Pre-visit preparation completed: Yes  Pain : No/denies pain     Nutritional Status: BMI 25 -29 Overweight Nutritional Risks: None Diabetes: No  How often do you need to have someone help you when you read instructions, pamphlets, or other written materials from your doctor or pharmacy?: 1 - Never What is the last grade level you completed in school?: 8th grade  Diabetic? no  Interpreter Needed?: No  Information entered by :: NAllen LPN   Activities of Daily Living In your present state of health, do you have any difficulty performing the following activities: 10/10/2020  Hearing? Y  Vision? N  Difficulty concentrating or making decisions? N  Walking or climbing stairs? N  Dressing or bathing? N  Doing errands, shopping? N  Preparing Food and eating ? N  Using the Toilet? N  In the past six months, have you accidently leaked urine? N  Do you have problems with loss of bowel control? N  Managing your Medications? N  Managing your Finances? N  Housekeeping or managing your Housekeeping? N  Some recent data might be hidden    Patient Care Team: Marjie Skiff, NP as PCP - General (Nurse Practitioner)  Indicate any recent Medical Services you may have received from other than [facility] providers in the past year (date may be approximate).     Assessment:   This is a routine wellness examination for  [name].  Hearing/Vision screen  Hearing Screening   125Hz  250Hz  500Hz  1000Hz  2000Hz  3000Hz  4000Hz  6000Hz  8000Hz   Right ear:           Left ear:           Vision Screening Comments: Regular eye exams, [facility]  Dietary issues and exercise activities discussed: Current Exercise Habits: Home exercise routine, Type of exercise: walking, Time (Minutes): 20, Frequency (Times/Week): 6, Weekly Exercise (Minutes/Week): 120  Goals Addressed            This Visit's Progress   . Patient Stated       10/10/2020, eat healthier      Depression Screen PHQ 2/9 Scores 10/10/2020 06/29/2020 10/07/2019 05/21/2019 09/24/2018 09/18/2017 08/21/2016  PHQ - 2 Score 0 0 0 0 0 0 0  PHQ- 9 Score - 0 - - - -  0    Fall Risk Fall Risk  10/10/2020 06/29/2020 10/07/2019 05/21/2019 09/24/2018  Falls in the past year? 0 0 0 0 0  Number falls in past yr: - - 0 0 -  Injury with Fall? - - 0 0 -  Risk for fall due to : Medication side effect - - - -  Follow up Falls evaluation completed;Education provided;Falls prevention discussed - - - -    FALL RISK PREVENTION PERTAINING TO THE HOME:  Any stairs in or around the home? Yes  If so, are there any without handrails? No  Home free of loose throw rugs in walkways, pet beds, electrical cords, etc? Yes  Adequate lighting in your home to reduce risk of falls? Yes   ASSISTIVE DEVICES UTILIZED TO PREVENT FALLS:  Life alert? No  Use of a cane, walker or w/c? No  Grab bars in the bathroom? No  Shower chair or bench in shower? No  Elevated toilet seat or a handicapped toilet? No   TIMED UP AND GO:  Was the test performed? No   Cognitive Function:     6CIT Screen 10/10/2020 09/24/2018 09/18/2017  What Year? 0 points 0 points 0 points  What month? 0 points 0 points 0 points  What time? 0 points 0 points 0 points  Count back from 20 0 points 0 points 0 points  Months in reverse 0 points 0 points 0 points  Repeat phrase 2 points 0 points 0 points  Total Score 2 0 0     Immunizations Immunization History  Administered Date(s) Administered  . Fluad Quad(high Dose 65+) 05/08/2020  . Influenza, High Dose Seasonal PF 02/27/2017, 03/25/2018, 05/14/2019  . Influenza-Unspecified 03/09/2014, 06/07/2015, 04/09/2016  . PFIZER(Purple Top)SARS-COV-2 Vaccination 06/16/2019, 07/07/2019, 03/10/2020  . Pneumococcal Conjugate-13 07/27/2015  . Pneumococcal Polysaccharide-23 03/01/2008  . Tdap 06/29/2020  . Zoster 03/01/2008    TDAP status: Up to date  Flu Vaccine status: Up to date  Pneumococcal vaccine status: Up to date  Covid-19 vaccine status: Completed vaccines  Qualifies for Shingles Vaccine? Yes   Zostavax completed Yes   Shingrix Completed?: No.    Education has been provided regarding the importance of this vaccine. Patient has been advised to call insurance company to determine out of pocket expense if they have not yet received this vaccine. Advised may also receive vaccine at local pharmacy or Health Dept. Verbalized acceptance and understanding.  Screening Tests Health Maintenance  Topic Date Due  . INFLUENZA VACCINE  01/02/2021  . TETANUS/TDAP  06/29/2030  . DEXA SCAN  Completed  . COVID-19 Vaccine  Completed  . PNA vac Low Risk Adult  Completed  . HPV VACCINES  Aged Out    Health Maintenance  There are no preventive care reminders to display for this patient.  Colorectal cancer screening: No longer required.   Mammogram status: No longer required due to age.  Bone Density status: Completed 01/01/2018.   Lung Cancer Screening: (Low Dose CT Chest recommended if Age 21-80 years, 30 pack-year currently smoking OR have quit w/in 15years.) does not qualify.   Lung Cancer Screening Referral: no  Additional Screening:  Hepatitis C Screening: does not qualify;   Vision Screening: Recommended annual ophthalmology exams for early detection of glaucoma and other disorders of the eye. Is the patient up to date with their annual eye exam?   Yes  Who is the provider or what is the name of the office in which the patient attends annual eye exams? [facility] If pt is not established with a provider, would they like to be referred to a provider to establish care? No .   Dental Screening: Recommended annual dental exams for proper oral hygiene  Community Resource Referral / Chronic Care Management: CRR required this visit?  No   CCM required this visit?  No      Plan:     I have personally reviewed and noted the following in the patient's chart:   . Medical and social history . Use of alcohol, tobacco or illicit drugs  . Current medications and supplements including opioid prescriptions.  . Functional ability and status . Nutritional status . Physical activity . Advanced directives . List of other physicians . Hospitalizations, surgeries, and ER visits in previous 12 months . Vitals . Screenings to include cognitive, depression, and falls . Referrals and appointments  In addition, I have reviewed and discussed with patient certain preventive protocols, quality metrics, and best practice recommendations. A written personalized care plan for preventive services as well as general preventive health recommendations were provided to patient.     Barb Merino, LPN   11/05/8935   Nurse Notes:

## 2020-10-10 NOTE — Patient Instructions (Signed)
Judith Hickman , Thank you for taking time to come for your Medicare Wellness Visit. I appreciate your ongoing commitment to your health goals. Please review the following plan we discussed and let me know if I can assist you in the future.   Screening recommendations/referrals: Colonoscopy: not required Mammogram: not required Bone Density: completed 01/01/2018 Recommended yearly ophthalmology/optometry visit for glaucoma screening and checkup Recommended yearly dental visit for hygiene and checkup  Vaccinations: Influenza vaccine: completed 05/08/2020, due 01/02/2021 Pneumococcal vaccine: completed 07/27/2015 Tdap vaccine: completed 06/29/2020, due 06/29/2030 Shingles vaccine: discussed   Covid-19: 03/10/2020, 07/07/2019, 06/16/2019  Advanced directives: Please bring a copy of your POA (Power of Attorney) and/or Living Will to your next appointment.   Conditions/risks identified: none  Next appointment: Follow up in one year for your annual wellness visit    Preventive Care 65 Years and Older, Female Preventive care refers to lifestyle choices and visits with your health care provider that can promote health and wellness. What does preventive care include?  A yearly physical exam. This is also called an annual well check.  Dental exams once or twice a year.  Routine eye exams. Ask your health care provider how often you should have your eyes checked.  Personal lifestyle choices, including:  Daily care of your teeth and gums.  Regular physical activity.  Eating a healthy diet.  Avoiding tobacco and drug use.  Limiting alcohol use.  Practicing safe sex.  Taking low-dose aspirin every day.  Taking vitamin and mineral supplements as recommended by your health care provider. What happens during an annual well check? The services and screenings done by your health care provider during your annual well check will depend on your age, overall health, lifestyle risk factors, and family  history of disease. Counseling  Your health care provider may ask you questions about your:  Alcohol use.  Tobacco use.  Drug use.  Emotional well-being.  Home and relationship well-being.  Sexual activity.  Eating habits.  History of falls.  Memory and ability to understand (cognition).  Work and work Astronomer.  Reproductive health. Screening  You may have the following tests or measurements:  Height, weight, and BMI.  Blood pressure.  Lipid and cholesterol levels. These may be checked every 5 years, or more frequently if you are over 76 years old.  Skin check.  Lung cancer screening. You may have this screening every year starting at age 10 if you have a 30-pack-year history of smoking and currently smoke or have quit within the past 15 years.  Fecal occult blood test (FOBT) of the stool. You may have this test every year starting at age 90.  Flexible sigmoidoscopy or colonoscopy. You may have a sigmoidoscopy every 5 years or a colonoscopy every 10 years starting at age 13.  Hepatitis C blood test.  Hepatitis B blood test.  Sexually transmitted disease (STD) testing.  Diabetes screening. This is done by checking your blood sugar (glucose) after you have not eaten for a while (fasting). You may have this done every 1-3 years.  Bone density scan. This is done to screen for osteoporosis. You may have this done starting at age 69.  Mammogram. This may be done every 1-2 years. Talk to your health care provider about how often you should have regular mammograms. Talk with your health care provider about your test results, treatment options, and if necessary, the need for more tests. Vaccines  Your health care provider may recommend certain vaccines, such as:  Influenza  vaccine. This is recommended every year.  Tetanus, diphtheria, and acellular pertussis (Tdap, Td) vaccine. You may need a Td booster every 10 years.  Zoster vaccine. You may need this after  age 50.  Pneumococcal 13-valent conjugate (PCV13) vaccine. One dose is recommended after age 36.  Pneumococcal polysaccharide (PPSV23) vaccine. One dose is recommended after age 87. Talk to your health care provider about which screenings and vaccines you need and how often you need them. This information is not intended to replace advice given to you by your health care provider. Make sure you discuss any questions you have with your health care provider. Document Released: 06/17/2015 Document Revised: 02/08/2016 Document Reviewed: 03/22/2015 Elsevier Interactive Patient Education  2017 [place] Prevention in the Home Falls can cause injuries. They can happen to people of all ages. There are many things you can do to make your home safe and to help prevent falls. What can I do on the outside of my home?  Regularly fix the edges of walkways and driveways and fix any cracks.  Remove anything that might make you trip as you walk through a door, such as a raised step or threshold.  Trim any bushes or trees on the path to your home.  Use bright outdoor lighting.  Clear any walking paths of anything that might make someone trip, such as rocks or tools.  Regularly check to see if handrails are loose or broken. Make sure that both sides of any steps have handrails.  Any raised decks and porches should have guardrails on the edges.  Have any leaves, snow, or ice cleared regularly.  Use sand or salt on walking paths during winter.  Clean up any spills in your garage right away. This includes oil or grease spills. What can I do in the bathroom?  Use night lights.  Install grab bars by the toilet and in the tub and shower. Do not use towel bars as grab bars.  Use non-skid mats or decals in the tub or shower.  If you need to sit down in the shower, use a plastic, non-slip stool.  Keep the floor dry. Clean up any water that spills on the floor as soon as it  happens.  Remove soap buildup in the tub or shower regularly.  Attach bath mats securely with double-sided non-slip rug tape.  Do not have throw rugs and other things on the floor that can make you trip. What can I do in the bedroom?  Use night lights.  Make sure that you have a light by your bed that is easy to reach.  Do not use any sheets or blankets that are too big for your bed. They should not hang down onto the floor.  Have a firm chair that has side arms. You can use this for support while you get dressed.  Do not have throw rugs and other things on the floor that can make you trip. What can I do in the kitchen?  Clean up any spills right away.  Avoid walking on wet floors.  Keep items that you use a lot in easy-to-reach places.  If you need to reach something above you, use a strong step stool that has a grab bar.  Keep electrical cords out of the way.  Do not use floor polish or wax that makes floors slippery. If you must use wax, use non-skid floor wax.  Do not have throw rugs and other things on the floor that can  make you trip. What can I do with my stairs?  Do not leave any items on the stairs.  Make sure that there are handrails on both sides of the stairs and use them. Fix handrails that are broken or loose. Make sure that handrails are as long as the stairways.  Check any carpeting to make sure that it is firmly attached to the stairs. Fix any carpet that is loose or worn.  Avoid having throw rugs at the top or bottom of the stairs. If you do have throw rugs, attach them to the floor with carpet tape.  Make sure that you have a light switch at the top of the stairs and the bottom of the stairs. If you do not have them, ask someone to add them for you. What else can I do to help prevent falls?  Wear shoes that:  Do not have high heels.  Have rubber bottoms.  Are comfortable and fit you well.  Are closed at the toe. Do not wear sandals.  If you  use a stepladder:  Make sure that it is fully opened. Do not climb a closed stepladder.  Make sure that both sides of the stepladder are locked into place.  Ask someone to hold it for you, if possible.  Clearly mark and make sure that you can see:  Any grab bars or handrails.  First and last steps.  Where the edge of each step is.  Use tools that help you move around (mobility aids) if they are needed. These include:  Canes.  Walkers.  Scooters.  Crutches.  Turn on the lights when you go into a dark area. Replace any light bulbs as soon as they burn out.  Set up your furniture so you have a clear path. Avoid moving your furniture around.  If any of your floors are uneven, fix them.  If there are any pets around you, be aware of where they are.  Review your medicines with your doctor. Some medicines can make you feel dizzy. This can increase your chance of falling. Ask your doctor what other things that you can do to help prevent falls. This information is not intended to replace advice given to you by your health care provider. Make sure you discuss any questions you have with your health care provider. Document Released: 03/17/2009 Document Revised: 10/27/2015 Document Reviewed: 06/25/2014 Elsevier Interactive Patient Education  2017 [employer].

## 2020-12-28 ENCOUNTER — Encounter: Payer: Self-pay | Admitting: Nurse Practitioner

## 2020-12-28 ENCOUNTER — Other Ambulatory Visit: Payer: Self-pay

## 2020-12-28 ENCOUNTER — Ambulatory Visit: Payer: Medicare PPO | Admitting: Nurse Practitioner

## 2020-12-28 VITALS — BP 133/70 | HR 59 | Temp 98.7°F | Wt 123.8 lb

## 2020-12-28 DIAGNOSIS — M8588 Other specified disorders of bone density and structure, other site: Secondary | ICD-10-CM

## 2020-12-28 DIAGNOSIS — D508 Other iron deficiency anemias: Secondary | ICD-10-CM

## 2020-12-28 DIAGNOSIS — E782 Mixed hyperlipidemia: Secondary | ICD-10-CM

## 2020-12-28 DIAGNOSIS — I10 Essential (primary) hypertension: Secondary | ICD-10-CM | POA: Diagnosis not present

## 2020-12-28 DIAGNOSIS — Z0000 Encounter for general adult medical examination without abnormal findings: Secondary | ICD-10-CM

## 2020-12-28 NOTE — Assessment & Plan Note (Signed)
Chronic, stable with BP at goal for age in office and at home.  Continue current medication regimen and adjust as needed.  Recommend she notify provider if BP ever consistently runs lower at home or has dizzy spells, then may reduce medication.  Continue to monitor BP at home and focus on DASH diet.  BMP today. Return in 6 months.

## 2020-12-28 NOTE — Assessment & Plan Note (Signed)
Chronic, no current statin.  Refuses to initiate, which is appropriate at this time based on age.  Lipid panel today and continue diet focus at home.

## 2020-12-28 NOTE — Assessment & Plan Note (Signed)
Chronic, stable with daily iron intake.  Check CBC and iron level today.  Continue current supplement.  Return in 6 months.

## 2020-12-28 NOTE — Assessment & Plan Note (Signed)
Ongoing.  Bone density -2.0.  Repeat in 2024.  Taking Evista, continue this and Vitamin D.  Recheck Vit D level today.

## 2020-12-28 NOTE — Assessment & Plan Note (Signed)
Due for 4th Covid vaccine and Shingrix -- wishes to think about these.

## 2020-12-28 NOTE — Patient Instructions (Signed)
https://www.nhlbi.nih.gov/files/docs/public/heart/dash_brief.pdf">  DASH Eating Plan DASH stands for Dietary Approaches to Stop Hypertension. The DASH eating plan is a healthy eating plan that has been shown to: Reduce high blood pressure (hypertension). Reduce your risk for type 2 diabetes, heart disease, and stroke. Help with weight loss. What are tips for following this plan? Reading food labels Check food labels for the amount of salt (sodium) per serving. Choose foods with less than 5 percent of the Daily Value of sodium. Generally, foods with less than 300 milligrams (mg) of sodium per serving fit into this eating plan. To find whole grains, look for the word "whole" as the first word in the ingredient list. Shopping Buy products labeled as "low-sodium" or "no salt added." Buy fresh foods. Avoid canned foods and pre-made or frozen meals. Cooking Avoid adding salt when cooking. Use salt-free seasonings or herbs instead of table salt or sea salt. Check with your health care provider or pharmacist before using salt substitutes. Do not fry foods. Cook foods using healthy methods such as baking, boiling, grilling, roasting, and broiling instead. Cook with heart-healthy oils, such as olive, canola, avocado, soybean, or sunflower oil. Meal planning  Eat a balanced diet that includes: 4 or more servings of fruits and 4 or more servings of vegetables each day. Try to fill one-half of your plate with fruits and vegetables. 6-8 servings of whole grains each day. Less than 6 oz (170 g) of lean meat, poultry, or fish each day. A 3-oz (85-g) serving of meat is about the same size as a deck of cards. One egg equals 1 oz (28 g). 2-3 servings of low-fat dairy each day. One serving is 1 cup (237 mL). 1 serving of nuts, seeds, or beans 5 times each week. 2-3 servings of heart-healthy fats. Healthy fats called omega-3 fatty acids are found in foods such as walnuts, flaxseeds, fortified milks, and eggs.  These fats are also found in cold-water fish, such as sardines, salmon, and mackerel. Limit how much you eat of: Canned or prepackaged foods. Food that is high in trans fat, such as some fried foods. Food that is high in saturated fat, such as fatty meat. Desserts and other sweets, sugary drinks, and other foods with added sugar. Full-fat dairy products. Do not salt foods before eating. Do not eat more than 4 egg yolks a week. Try to eat at least 2 vegetarian meals a week. Eat more home-cooked food and less restaurant, buffet, and fast food.  Lifestyle When eating at a restaurant, ask that your food be prepared with less salt or no salt, if possible. If you drink alcohol: Limit how much you use to: 0-1 drink a day for women who are not pregnant. 0-2 drinks a day for men. Be aware of how much alcohol is in your drink. In the [place] one drink equals one 12 oz bottle of beer (355 mL), one 5 oz glass of wine (148 mL), or one 1 oz glass of hard liquor (44 mL). General information Avoid eating more than 2,300 mg of salt a day. If you have hypertension, you may need to reduce your sodium intake to 1,500 mg a day. Work with your health care provider to maintain a healthy body weight or to lose weight. Ask what an ideal weight is for you. Get at least 30 minutes of exercise that causes your heart to beat faster (aerobic exercise) most days of the week. Activities may include walking, swimming, or biking. Work with your health care provider   or dietitian to adjust your eating plan to your individual calorie needs. What foods should I eat? Fruits All fresh, dried, or frozen fruit. Canned fruit in natural juice (without addedsugar). Vegetables Fresh or frozen vegetables (raw, steamed, roasted, or grilled). Low-sodium or reduced-sodium tomato and vegetable juice. Low-sodium or reduced-sodium tomatosauce and tomato paste. Low-sodium or reduced-sodium canned vegetables. Grains Whole-grain or  whole-wheat bread. Whole-grain or whole-wheat pasta. Brown rice. Oatmeal. Quinoa. Bulgur. Whole-grain and low-sodium cereals. Pita bread.Low-fat, low-sodium crackers. Whole-wheat flour tortillas. Meats and other proteins Skinless chicken or turkey. Ground chicken or turkey. Pork with fat trimmed off. Fish and seafood. Egg whites. Dried beans, peas, or lentils. Unsalted nuts, nut butters, and seeds. Unsalted canned beans. Lean cuts of beef with fat trimmed off. Low-sodium, lean precooked or cured meat, such as sausages or meatloaves. Dairy Low-fat (1%) or fat-free (skim) milk. Reduced-fat, low-fat, or fat-free cheeses. Nonfat, low-sodium ricotta or cottage cheese. Low-fat or nonfatyogurt. Low-fat, low-sodium cheese. Fats and oils Soft margarine without trans fats. Vegetable oil. Reduced-fat, low-fat, or light mayonnaise and salad dressings (reduced-sodium). Canola, safflower, olive, avocado, soybean, andsunflower oils. Avocado. Seasonings and condiments Herbs. Spices. Seasoning mixes without salt. Other foods Unsalted popcorn and pretzels. Fat-free sweets. The items listed above may not be a complete list of foods and beverages you can eat. Contact a dietitian for more information. What foods should I avoid? Fruits Canned fruit in a light or heavy syrup. Fried fruit. Fruit in cream or buttersauce. Vegetables Creamed or fried vegetables. Vegetables in a cheese sauce. Regular canned vegetables (not low-sodium or reduced-sodium). Regular canned tomato sauce and paste (not low-sodium or reduced-sodium). Regular tomato and vegetable juice(not low-sodium or reduced-sodium). Pickles. Olives. Grains Baked goods made with fat, such as croissants, muffins, or some breads. Drypasta or rice meal packs. Meats and other proteins Fatty cuts of meat. Ribs. Fried meat. Bacon. Bologna, salami, and other precooked or cured meats, such as sausages or meat loaves. Fat from the back of a pig (fatback). Bratwurst.  Salted nuts and seeds. Canned beans with added salt. Canned orsmoked fish. Whole eggs or egg yolks. Chicken or turkey with skin. Dairy Whole or 2% milk, cream, and half-and-half. Whole or full-fat cream cheese. Whole-fat or sweetened yogurt. Full-fat cheese. Nondairy creamers. Whippedtoppings. Processed cheese and cheese spreads. Fats and oils Butter. Stick margarine. Lard. Shortening. Ghee. Bacon fat. Tropical oils, suchas coconut, palm kernel, or palm oil. Seasonings and condiments Onion salt, garlic salt, seasoned salt, table salt, and sea salt. Worcestershire sauce. Tartar sauce. Barbecue sauce. Teriyaki sauce. Soy sauce, including reduced-sodium. Steak sauce. Canned and packaged gravies. Fish sauce. Oyster sauce. Cocktail sauce. Store-bought horseradish. Ketchup. Mustard. Meat flavorings and tenderizers. Bouillon cubes. Hot sauces. Pre-made or packaged marinades. Pre-made or packaged taco seasonings. Relishes. Regular saladdressings. Other foods Salted popcorn and pretzels. The items listed above may not be a complete list of foods and beverages you should avoid. Contact a dietitian for more information. Where to find more information National Heart, Lung, and Blood Institute: www.nhlbi.nih.gov American Heart Association: www.heart.org Academy of Nutrition and Dietetics: www.eatright.org National Kidney Foundation: www.kidney.org Summary The DASH eating plan is a healthy eating plan that has been shown to reduce high blood pressure (hypertension). It may also reduce your risk for type 2 diabetes, heart disease, and stroke. When on the DASH eating plan, aim to eat more fresh fruits and vegetables, whole grains, lean proteins, low-fat dairy, and heart-healthy fats. With the DASH eating plan, you should limit salt (sodium) intake to 2,300   mg a day. If you have hypertension, you may need to reduce your sodium intake to 1,500 mg a day. Work with your health care provider or dietitian to adjust  your eating plan to your individual calorie needs. This information is not intended to replace advice given to you by your health care provider. Make sure you discuss any questions you have with your healthcare provider. Document Revised: 04/24/2019 Document Reviewed: 04/24/2019 Elsevier Patient Education  2022 Elsevier Inc.  

## 2020-12-28 NOTE — Progress Notes (Signed)
BP 133/70   Pulse (!) 59   Temp 98.7 F (37.1 C) (Oral)   Wt 123 lb 12.8 oz (56.2 kg)   LMP  (LMP Unknown)   SpO2 94%   BMI 25.87 kg/m    Subjective:    Patient ID: Judith Hickman, female    DOB: 13-May-1934, 87 y.o.   MRN: [id number]  HPI: Judith Hickman is a 87 y.o. female  Chief Complaint  Patient presents with   Hyperlipidemia   Hypertension   Osteopenia   HYPERTENSION / HYPERLIPIDEMIA Continues on Amlodipine and HCTZ.  No current statin. Satisfied with current treatment? yes Duration of hypertension: chronic BP monitoring frequency: daily BP range: 120-130/70 range BP medication side effects: no Duration of hyperlipidemia: chronic Aspirin: no Recent stressors: no Recurrent headaches: no Visual changes: no Palpitations: no Dyspnea: no Chest pain: no Lower extremity edema: no Dizzy/lightheaded: no  The ASCVD Risk score Denman George DC Jr., et al., 2013) failed to calculate for the following reasons:   The 2013 ASCVD risk score is only valid for ages 72 to 40  OSTEOPENIA Last scan 01/01/18 noting osteopenia with T-score -2.0.  Continues Vit D and Evista. Satisfied with current treatment?: yes Medication side effects: no Medication compliance: good compliance Past osteoporosis medications/treatments: Evista Adequate calcium & vitamin D: yes Intolerance to bisphosphonates:no Weight bearing exercises: yes  ANEMIA Takes iron daily for history of low iron levels. Anemia status: stable Etiology of anemia: iron deficiency Duration of anemia treatment: many years Compliance with treatment: good compliance Iron supplementation side effects: no Severity of anemia: mild Fatigue: no Decreased exercise tolerance: no  Dyspnea on exertion: no Palpitations: no Bleeding: no Pica: no   Relevant past medical, surgical, family and social history reviewed and updated as indicated. Interim medical history since our last visit reviewed. Allergies and medications reviewed  and updated.  Review of Systems  Constitutional:  Negative for activity change, appetite change, diaphoresis, fatigue and fever.  Respiratory:  Negative for cough, chest tightness and shortness of breath.   Cardiovascular:  Negative for chest pain, palpitations and leg swelling.  Gastrointestinal: Negative.   Neurological: Negative.   Psychiatric/Behavioral: Negative.     Per HPI unless specifically indicated above     Objective:    BP 133/70   Pulse (!) 59   Temp 98.7 F (37.1 C) (Oral)   Wt 123 lb 12.8 oz (56.2 kg)   LMP  (LMP Unknown)   SpO2 94%   BMI 25.87 kg/m   Wt Readings from Last 3 Encounters:  12/28/20 123 lb 12.8 oz (56.2 kg)  10/10/20 122 lb (55.3 kg)  06/29/20 123 lb (55.8 kg)    Physical Exam Vitals and nursing note reviewed.  Constitutional:      General: She is awake. She is not in acute distress.    Appearance: She is well-developed and well-groomed. She is not ill-appearing or toxic-appearing.  HENT:     Head: Normocephalic.     Right Ear: Hearing normal.     Left Ear: Hearing normal.  Eyes:     General: Lids are normal.        Right eye: No discharge.        Left eye: No discharge.     Conjunctiva/sclera: Conjunctivae normal.     Pupils: Pupils are equal, round, and reactive to light.  Neck:     Thyroid: No thyromegaly.     Vascular: No carotid bruit.  Cardiovascular:     Rate and Rhythm:  Normal rate and regular rhythm.     Heart sounds: Normal heart sounds. No murmur heard.   No gallop.  Pulmonary:     Effort: Pulmonary effort is normal.     Breath sounds: Normal breath sounds.  Abdominal:     General: Bowel sounds are normal.     Palpations: Abdomen is soft.  Musculoskeletal:     Cervical back: Normal range of motion and neck supple.     Right lower leg: No edema.     Left lower leg: No edema.  Lymphadenopathy:     Cervical: No cervical adenopathy.  Skin:    General: Skin is warm and dry.  Neurological:     Mental Status: She is  alert and oriented to person, place, and time.     Deep Tendon Reflexes: Reflexes are normal and symmetric.     Reflex Scores:      Brachioradialis reflexes are 2+ on the right side and 2+ on the left side.      Patellar reflexes are 2+ on the right side and 2+ on the left side. Psychiatric:        Attention and Perception: Attention normal.        Mood and Affect: Mood normal.        Speech: Speech normal.        Behavior: Behavior normal. Behavior is cooperative.        Thought Content: Thought content normal.    Results for orders placed or performed in visit on 06/29/20  Basic metabolic panel  Result Value Ref Range   Glucose 116 (H) 65 - 99 mg/dL   BUN 22 8 - 27 mg/dL   Creatinine, Ser 6.57 0.57 - 1.00 mg/dL   GFR calc non Af Amer 61 >59 mL/min/1.73   GFR calc Af Amer 70 >59 mL/min/1.73   BUN/Creatinine Ratio 25 12 - 28   Sodium 139 134 - 144 mmol/L   Potassium 4.2 3.5 - 5.2 mmol/L   Chloride 101 96 - 106 mmol/L   CO2 25 20 - 29 mmol/L   Calcium 9.6 8.7 - 10.3 mg/dL  Lipid Panel w/o Chol/HDL Ratio  Result Value Ref Range   Cholesterol, Total 255 (H) 100 - 199 mg/dL   Triglycerides 846 0 - 149 mg/dL   HDL 64 >96 mg/dL   VLDL Cholesterol Cal 25 5 - 40 mg/dL   LDL Chol Calc (NIH) 295 (H) 0 - 99 mg/dL  TSH  Result Value Ref Range   TSH 1.040 0.450 - 4.500 uIU/mL  VITAMIN D 25 Hydroxy (Vit-D Deficiency, Fractures)  Result Value Ref Range   Vit D, 25-Hydroxy 38.6 30.0 - 100.0 ng/mL      Assessment & Plan:   Problem List Items Addressed This Visit       Cardiovascular and Mediastinum   Essential hypertension, benign - Primary    Chronic, stable with BP at goal for age in office and at home.  Continue current medication regimen and adjust as needed.  Recommend she notify provider if BP ever consistently runs lower at home or has dizzy spells, then may reduce medication.  Continue to monitor BP at home and focus on DASH diet.  BMP today. Return in 6 months.        Relevant Orders   Basic metabolic panel     Musculoskeletal and Integument   Osteopenia    Ongoing.  Bone density -2.0.  Repeat in 2024.  Taking Evista, continue this and Vitamin D.  Recheck Vit D level today.       Relevant Orders   VITAMIN D 25 Hydroxy (Vit-D Deficiency, Fractures)     Other   Fe deficiency anemia    Chronic, stable with daily iron intake.  Check CBC and iron level today.  Continue current supplement.  Return in 6 months.       Relevant Orders   Iron, TIBC and Ferritin Panel   CBC with Differential/Platelet   Hyperlipidemia    Chronic, no current statin.  Refuses to initiate, which is appropriate at this time based on age.  Lipid panel today and continue diet focus at home.       Relevant Orders   Lipid Panel w/o Chol/HDL Ratio   Healthcare maintenance    Due for 4th Covid vaccine and Shingrix -- wishes to think about these.         Follow up plan: Return in about 6 months (around 06/30/2021) for HTN/HLD, OSTEOPENIA -- HEALTH MAINTENANCE CHECK.

## 2020-12-29 LAB — CBC WITH DIFFERENTIAL/PLATELET
Basophils Absolute: 0 10*3/uL (ref 0.0–0.2)
Basos: 1 %
EOS (ABSOLUTE): 0.2 10*3/uL (ref 0.0–0.4)
Eos: 3 %
Hematocrit: 39.8 % (ref 34.0–46.6)
Hemoglobin: 13.1 g/dL (ref 11.1–15.9)
Immature Grans (Abs): 0 10*3/uL (ref 0.0–0.1)
Immature Granulocytes: 0 %
Lymphocytes Absolute: 2.3 10*3/uL (ref 0.7–3.1)
Lymphs: 40 %
MCH: 30.1 pg (ref 26.6–33.0)
MCHC: 32.9 g/dL (ref 31.5–35.7)
MCV: 92 fL (ref 79–97)
Monocytes Absolute: 0.3 10*3/uL (ref 0.1–0.9)
Monocytes: 5 %
Neutrophils Absolute: 2.9 10*3/uL (ref 1.4–7.0)
Neutrophils: 51 %
Platelets: 258 10*3/uL (ref 150–450)
RBC: 4.35 x10E6/uL (ref 3.77–5.28)
RDW: 11.8 % (ref 11.7–15.4)
WBC: 5.7 10*3/uL (ref 3.4–10.8)

## 2020-12-29 LAB — BASIC METABOLIC PANEL
BUN/Creatinine Ratio: 33 — ABNORMAL HIGH (ref 12–28)
BUN: 24 mg/dL (ref 8–27)
CO2: 23 mmol/L (ref 20–29)
Calcium: 9.8 mg/dL (ref 8.7–10.3)
Chloride: 102 mmol/L (ref 96–106)
Creatinine, Ser: 0.72 mg/dL (ref 0.57–1.00)
Glucose: 99 mg/dL (ref 65–99)
Potassium: 4.1 mmol/L (ref 3.5–5.2)
Sodium: 140 mmol/L (ref 134–144)
eGFR: 82 mL/min/{1.73_m2} (ref >59)

## 2020-12-29 LAB — LIPID PANEL W/O CHOL/HDL RATIO
Cholesterol, Total: 234 mg/dL — ABNORMAL HIGH (ref 100–199)
HDL: 55 mg/dL (ref >39)
LDL Chol Calc (NIH): 133 mg/dL — ABNORMAL HIGH (ref 0–99)
Triglycerides: 258 mg/dL — ABNORMAL HIGH (ref 0–149)
VLDL Cholesterol Cal: 46 mg/dL — ABNORMAL HIGH (ref 5–40)

## 2020-12-29 LAB — IRON,TIBC AND FERRITIN PANEL
Ferritin: 204 ng/mL — ABNORMAL HIGH (ref 15–150)
Iron Saturation: 28 % (ref 15–55)
Iron: 84 ug/dL (ref 27–139)
Total Iron Binding Capacity: 302 ug/dL (ref 250–450)
UIBC: 218 ug/dL (ref 118–369)

## 2020-12-29 LAB — VITAMIN D 25 HYDROXY (VIT D DEFICIENCY, FRACTURES): Vit D, 25-Hydroxy: 42.6 ng/mL (ref 30.0–100.0)

## 2020-12-29 NOTE — Progress Notes (Signed)
Good morning, please let [name] know her labs have returned and overall remain stable.  Cholesterol levels remain elevated, but continue diet focus at this time.  No medication changes needed.  Great job!! Keep being awesome!!  Thank you for allowing me to participate in your care.  I appreciate you. Kindest regards, Judith Hickman

## 2021-06-28 ENCOUNTER — Ambulatory Visit: Payer: Medicare PPO | Admitting: Nurse Practitioner

## 2021-06-30 ENCOUNTER — Other Ambulatory Visit: Payer: Self-pay | Admitting: Nurse Practitioner

## 2021-06-30 NOTE — Telephone Encounter (Signed)
Requested medication (s) are due for refill today: Yes  Requested medication (s) are on the active medication list: Yes  Last refill:  06/29/20  Future visit scheduled: Yes  Notes to clinic:  Prescriptions have expired.    Requested Prescriptions  Pending Prescriptions Disp Refills   hydrochlorothiazide (MICROZIDE) 12.5 MG capsule [Pharmacy Med Name: HYDROCHLOROTHIAZIDE 12.5MG  CAPSULES] 90 capsule 4    Sig: TAKE 1 CAPSULE BY MOUTH EVERY DAY     Cardiovascular: Diuretics - Thiazide Failed - 06/30/2021  7:32 AM      Failed - Valid encounter within last 6 months    Recent Outpatient Visits           6 months ago Essential hypertension, benign   [facility] [place], [place] T, NP   1 year ago Essential hypertension, benign   [facility] [place], [place] T, NP   1 year ago Essential hypertension, benign   [facility] [place], [place] T, NP   2 years ago Osteopenia of lumbar spine   [facility] Gabriel Cirri, NP   2 years ago Essential hypertension, benign   [facility] [place], [place], [place]       Future Appointments             In 4 weeks [name], Dorie Rank, NP [facility]   In 3 months  [facility]            Passed - Ca in normal range and within 360 days    Calcium  Date Value Ref Range Status  12/28/2020 9.8 8.7 - 10.3 mg/dL Final   Calcium, Total  Date Value Ref Range Status  10/20/2013 9.1 8.5 - 10.1 mg/dL Final          Passed - Cr in normal range and within 360 days    Creatinine  Date Value Ref Range Status  10/20/2013 0.75 0.60 - 1.30 mg/dL Final   Creatinine, Ser  Date Value Ref Range Status  12/28/2020 0.72 0.57 - 1.00 mg/dL Final          Passed - K in normal range and within 360 days    Potassium  Date Value Ref Range Status  12/28/2020 4.1 3.5 - 5.2 mmol/L Final  10/20/2013 4.2 3.5 - 5.1 mmol/L Final          Passed - Na in normal  range and within 360 days    Sodium  Date Value Ref Range Status  12/28/2020 140 134 - 144 mmol/L Final  10/20/2013 139 136 - 145 mmol/L Final          Passed - Last BP in normal range    BP Readings from Last 1 Encounters:  12/28/20 133/70           metoprolol succinate (TOPROL-XL) 50 MG 24 hr tablet [Pharmacy Med Name: METOPROLOL ER SUCCINATE 50MG  TABS] 90 tablet 4    Sig: TAKE 1 TABLET BY MOUTH EVERY DAY WITH OR IMMEDIATELY FOLLOWING A MEAL     Cardiovascular:  Beta Blockers Failed - 06/30/2021  7:32 AM      Failed - Valid encounter within last 6 months    Recent Outpatient Visits           6 months ago Essential hypertension, benign   [facility] [place], [place] T, NP   1 year ago Essential hypertension, benign   [facility] [place], [place] T, NP   1 year ago Essential  hypertension, benign   [facility] [place], [place] T, NP   2 years ago Osteopenia of lumbar spine   [facility] Gabriel Cirri, NP   2 years ago Essential hypertension, benign   [facility] [place], [place], [place]       Future Appointments             In 4 weeks [place], Dorie Rank, NP [facility]   In 3 months  [facility]            Passed - Last BP in normal range    BP Readings from Last 1 Encounters:  12/28/20 133/70          Passed - Last Heart Rate in normal range    Pulse Readings from Last 1 Encounters:  12/28/20 (!) 59           raloxifene (EVISTA) 60 MG tablet [Pharmacy Med Name: RALOXIFENE 60MG  TABLETS] 90 tablet 4    Sig: TAKE 1 TABLET(60 MG) BY MOUTH DAILY     OB/GYN:  Selective Estrogen Receptor Modulators Passed - 06/30/2021  7:32 AM      Passed - Valid encounter within last 12 months    Recent Outpatient Visits           6 months ago Essential hypertension, benign   [facility] [place], [place] T, NP   1 year ago Essential hypertension, benign    [facility] [place], [place] T, NP   1 year ago Essential hypertension, benign   [facility] [place], [place] T, NP   2 years ago Osteopenia of lumbar spine   [facility] [place], NP   2 years ago Essential hypertension, benign   [facility] [place], [place], [place]       Future Appointments             In 4 weeks [name], [place], NP Dorie Rank, [facility]   In 3 months  [facility]

## 2021-07-23 NOTE — Patient Instructions (Incomplete)
Osteopenia ?Osteopenia is a loss of thickness (density) inside the bones. Another name for osteopenia is low bone mass. Mild osteopenia is a normal part of aging. It is not a disease, and it does not cause symptoms. ?However, if you have osteopenia and continue to lose bone mass, you could develop a condition that causes the bones to become thin and break more easily (osteoporosis). Osteoporosis can cause you to lose some height, have back pain, and have a stooped posture. Although osteopenia is not a disease, making changes to your lifestyle and diet can help to prevent osteopenia from developing into osteoporosis. ?What are the causes? ?Osteopenia is caused by loss of calcium in the bones. Bones are constantly changing. Old bone cells are continually being replaced with new bone cells. This process builds new bone. ?The mineral calcium is needed to build new bone and maintain bone density. Bone density is usually highest around age 35. After that, most people's bodies cannot replace all the bone they have lost with new bone. ?What increases the risk? ?You are more likely to develop this condition if: ?You are older than age 50. ?You are a woman who went through menopause early. ?You have a long illness that keeps you in bed. ?You do not get enough exercise. ?You lack certain nutrients (malnutrition). ?You have an overactive thyroid gland (hyperthyroidism). ?You use products that contain nicotine or tobacco, such as cigarettes, e-cigarettes and chewing tobacco, or you drink a lot of alcohol. ?You are taking medicines that weaken the bones, such as steroids. ?What are the signs or symptoms? ?This condition does not cause any symptoms. You may have a slightly higher risk for bone breaks (fractures), so getting fractures more easily than normal may be an indication of osteopenia. ?How is this diagnosed? ?This condition may be diagnosed based on an X-ray exam that measures bone density (dual-energy X-ray  absorptiometry, or DEXA). This test can measure bone density in your hips, spine, and wrists. ?Osteopenia has no symptoms, so this condition is usually diagnosed after a routine bone density screening test is done for osteoporosis. This routine screening is usually done for: ?Women who are age 65 or older. ?Men who are age 70 or older. ?If you have risk factors for osteopenia, you may have the screening test at an earlier age. ?How is this treated? ?Making dietary and lifestyle changes can lower your risk for osteoporosis. ?If you have severe osteopenia that is close to becoming osteoporosis, this condition can be treated with medicines and dietary supplements such as calcium and vitamin D. These supplements help to rebuild bone density. ?Follow these instructions at home: ?Eating and drinking ?Eat a diet that is high in calcium and vitamin D. ?Calcium is found in dairy products, beans, salmon, and leafy green vegetables like spinach and broccoli. ?Look for foods that have vitamin D and calcium added to them (fortified foods), such as orange juice, cereal, and bread. ? ?Lifestyle ?Do 30 minutes or more of a weight-bearing exercise every day, such as walking, jogging, or playing a sport. These types of exercises strengthen the bones. ?Do not use any products that contain nicotine or tobacco, such as cigarettes, e-cigarettes, and chewing tobacco. If you need help quitting, ask your health care provider. ?Do not drink alcohol if: ?Your health care provider tells you not to drink. ?You are pregnant, may be pregnant, or are planning to become pregnant. ?If you drink alcohol: ?Limit how much you use to: ?0-1 drink a day for women. ?0-2 drinks   a day for men. ?Be aware of how much alcohol is in your drink. In the [place] one drink equals one 12 oz bottle of beer (355 mL), one 5 oz glass of wine (148 mL), or one 1? oz glass of hard liquor (44 mL). ?General instructions ?Take over-the-counter and prescription medicines only as  told by your health care provider. These include vitamins and supplements. ?Take precautions at home to lower your risk of falling, such as: ?Keeping rooms well-lit and free of clutter, such as cords. ?Installing safety rails on stairs. ?Using rubber mats in the bathroom or other areas that are often wet or slippery. ?Keep all follow-up visits. This is important. ?Contact a health care provider if: ?You have not had a bone density screening for osteoporosis and you are: ?A woman who is age 65 or older. ?A man who is age 70 or older. ?You are a postmenopausal woman who has not had a bone density screening for osteoporosis. ?You are older than age 50 and you want to know if you should have bone density screening for osteoporosis. ?Summary ?Osteopenia is a loss of thickness (density) inside the bones. Another name for osteopenia is low bone mass. ?Osteopenia is not a disease, but it may increase your risk for a condition that causes the bones to become thin and break more easily (osteoporosis). ?You may be at risk for osteopenia if you are older than age 50 or if you are a woman who went through early menopause. ?Osteopenia does not cause any symptoms, but it can be diagnosed with a bone density screening test. ?Dietary and lifestyle changes are the first treatment for osteopenia. These may lower your risk for osteoporosis. ?This information is not intended to replace advice given to you by your health care provider. Make sure you discuss any questions you have with your health care provider. ?Document Revised: 11/05/2019 Document Reviewed: 11/05/2019 ?Elsevier Patient Education ? 2022 Elsevier Inc. ? ?

## 2021-07-28 ENCOUNTER — Ambulatory Visit: Payer: Medicare PPO | Admitting: Nurse Practitioner

## 2021-07-28 DIAGNOSIS — M8588 Other specified disorders of bone density and structure, other site: Secondary | ICD-10-CM

## 2021-07-28 DIAGNOSIS — E782 Mixed hyperlipidemia: Secondary | ICD-10-CM

## 2021-07-28 DIAGNOSIS — I10 Essential (primary) hypertension: Secondary | ICD-10-CM

## 2021-08-05 ENCOUNTER — Other Ambulatory Visit: Payer: Self-pay | Admitting: Nurse Practitioner

## 2021-08-07 NOTE — Telephone Encounter (Signed)
Requested Prescriptions  ?Pending Prescriptions Disp Refills  ?? amLODipine (NORVASC) 10 MG tablet [Pharmacy Med Name: AMLODIPINE BESYLATE 10MG  TABLETS] 90 tablet 0  ?  Sig: TAKE 1 TABLET(10 MG) BY MOUTH DAILY  ?  ? Cardiovascular: Calcium Channel Blockers 2 Failed - 08/05/2021  3:15 AM  ?  ?  Failed - Valid encounter within last 6 months  ?  Recent Outpatient Visits   ?      ? 7 months ago Essential hypertension, benign  ? [place], Henrine Screws T, NP  ? 1 year ago Essential hypertension, benign  ? [place], Henrine Screws T, NP  ? 1 year ago Essential hypertension, benign  ? [place], [place] T, NP  ? 2 years ago Osteopenia of lumbar spine  ? [facility] Kathrine Haddock, NP  ? 2 years ago Essential hypertension, benign  ? [place], [place], [place]  ?  ?  ?Future Appointments   ?        ? In 2 weeks [name], Barbaraann Faster, NP [employer]  ? In 2 months  [place]  ?  ? ?  ?  ?  Passed - Last BP in normal range  ?  BP Readings from Last 1 Encounters:  ?12/28/20 133/70  ?   ?  ?  Passed - Last Heart Rate in normal range  ?  Pulse Readings from Last 1 Encounters:  ?12/28/20 (!) 59  ?   ?  ?  ? ? ?

## 2021-08-19 NOTE — Patient Instructions (Incomplete)

## 2021-08-22 ENCOUNTER — Ambulatory Visit: Payer: Medicare PPO | Admitting: Nurse Practitioner

## 2021-08-22 DIAGNOSIS — I10 Essential (primary) hypertension: Secondary | ICD-10-CM

## 2021-08-22 DIAGNOSIS — E782 Mixed hyperlipidemia: Secondary | ICD-10-CM

## 2021-08-22 DIAGNOSIS — M8588 Other specified disorders of bone density and structure, other site: Secondary | ICD-10-CM

## 2021-09-02 NOTE — Patient Instructions (Addendum)
Start taking Claritin 10 MG daily and add on some Flonase nasal spray. ? ?DASH Eating Plan ?DASH stands for Dietary Approaches to Stop Hypertension. The DASH eating plan is a healthy eating plan that has been shown to: ?Reduce high blood pressure (hypertension). ?Reduce your risk for type 2 diabetes, heart disease, and stroke. ?Help with weight loss. ?What are tips for following this plan? ?Reading food labels ?Check food labels for the amount of salt (sodium) per serving. Choose foods with less than 5 percent of the Daily Value of sodium. Generally, foods with less than 300 milligrams (mg) of sodium per serving fit into this eating plan. ?To find whole grains, look for the word "whole" as the first word in the ingredient list. ?Shopping ?Buy products labeled as "low-sodium" or "no salt added." ?Buy fresh foods. Avoid canned foods and pre-made or frozen meals. ?Cooking ?Avoid adding salt when cooking. Use salt-free seasonings or herbs instead of table salt or sea salt. Check with your health care provider or pharmacist before using salt substitutes. ?Do not fry foods. Cook foods using healthy methods such as baking, boiling, grilling, roasting, and broiling instead. ?Cook with heart-healthy oils, such as olive, canola, avocado, soybean, or sunflower oil. ?Meal planning ? ?Eat a balanced diet that includes: ?4 or more servings of fruits and 4 or more servings of vegetables each day. Try to fill one-half of your plate with fruits and vegetables. ?6-8 servings of whole grains each day. ?Less than 6 oz (170 g) of lean meat, poultry, or fish each day. A 3-oz (85-g) serving of meat is about the same size as a deck of cards. One egg equals 1 oz (28 g). ?2-3 servings of low-fat dairy each day. One serving is 1 cup (237 mL). ?1 serving of nuts, seeds, or beans 5 times each week. ?2-3 servings of heart-healthy fats. Healthy fats called omega-3 fatty acids are found in foods such as walnuts, flaxseeds, fortified milks, and  eggs. These fats are also found in cold-water fish, such as sardines, salmon, and mackerel. ?Limit how much you eat of: ?Canned or prepackaged foods. ?Food that is high in trans fat, such as some fried foods. ?Food that is high in saturated fat, such as fatty meat. ?Desserts and other sweets, sugary drinks, and other foods with added sugar. ?Full-fat dairy products. ?Do not salt foods before eating. ?Do not eat more than 4 egg yolks a week. ?Try to eat at least 2 vegetarian meals a week. ?Eat more home-cooked food and less restaurant, buffet, and fast food. ?Lifestyle ?When eating at a restaurant, ask that your food be prepared with less salt or no salt, if possible. ?If you drink alcohol: ?Limit how much you use to: ?0-1 drink a day for women who are not pregnant. ?0-2 drinks a day for men. ?Be aware of how much alcohol is in your drink. In the [place] one drink equals one 12 oz bottle of beer (355 mL), one 5 oz glass of wine (148 mL), or one 1? oz glass of hard liquor (44 mL). ?General information ?Avoid eating more than 2,300 mg of salt a day. If you have hypertension, you may need to reduce your sodium intake to 1,500 mg a day. ?Work with your health care provider to maintain a healthy body weight or to lose weight. Ask what an ideal weight is for you. ?Get at least 30 minutes of exercise that causes your heart to beat faster (aerobic exercise) most days of the week. Activities may  include walking, swimming, or biking. ?Work with your health care provider or dietitian to adjust your eating plan to your individual calorie needs. ?What foods should I eat? ?Fruits ?All fresh, dried, or frozen fruit. Canned fruit in natural juice (without added sugar). ?Vegetables ?Fresh or frozen vegetables (raw, steamed, roasted, or grilled). Low-sodium or reduced-sodium tomato and vegetable juice. Low-sodium or reduced-sodium tomato sauce and tomato paste. Low-sodium or reduced-sodium canned vegetables. ?Grains ?Whole-grain or  whole-wheat bread. Whole-grain or whole-wheat pasta. Brown rice. Modena Morrow. Bulgur. Whole-grain and low-sodium cereals. Pita bread. Low-fat, low-sodium crackers. Whole-wheat flour tortillas. ?Meats and other proteins ?Skinless chicken or [place]. Ground chicken or [place]. Pork with fat trimmed off. Fish and seafood. Egg whites. Dried beans, peas, or lentils. Unsalted nuts, nut butters, and seeds. Unsalted canned beans. Lean cuts of beef with fat trimmed off. Low-sodium, lean precooked or cured meat, such as sausages or meat loaves. ?Dairy ?Low-fat (1%) or fat-free (skim) milk. Reduced-fat, low-fat, or fat-free cheeses. Nonfat, low-sodium ricotta or cottage cheese. Low-fat or nonfat yogurt. Low-fat, low-sodium cheese. ?Fats and oils ?Soft margarine without trans fats. Vegetable oil. Reduced-fat, low-fat, or light mayonnaise and salad dressings (reduced-sodium). Canola, safflower, olive, avocado, soybean, and sunflower oils. Avocado. ?Seasonings and condiments ?Herbs. Spices. Seasoning mixes without salt. ?Other foods ?Unsalted popcorn and pretzels. Fat-free sweets. ?The items listed above may not be a complete list of foods and beverages you can eat. Contact a dietitian for more information. ?What foods should I avoid? ?Fruits ?Canned fruit in a light or heavy syrup. Fried fruit. Fruit in cream or butter sauce. ?Vegetables ?Creamed or fried vegetables. Vegetables in a cheese sauce. Regular canned vegetables (not low-sodium or reduced-sodium). Regular canned tomato sauce and paste (not low-sodium or reduced-sodium). Regular tomato and vegetable juice (not low-sodium or reduced-sodium). Angie Fava. Olives. ?Grains ?Baked goods made with fat, such as croissants, muffins, or some breads. Dry pasta or rice meal packs. ?Meats and other proteins ?Fatty cuts of meat. Ribs. Fried meat. Berniece Salines. Bologna, salami, and other precooked or cured meats, such as sausages or meat loaves. Fat from the back of a pig (fatback).  Bratwurst. Salted nuts and seeds. Canned beans with added salt. Canned or smoked fish. Whole eggs or egg yolks. Chicken or [place] with skin. ?Dairy ?Whole or 2% milk, cream, and half-and-half. Whole or full-fat cream cheese. Whole-fat or sweetened yogurt. Full-fat cheese. Nondairy creamers. Whipped toppings. Processed cheese and cheese spreads. ?Fats and oils ?Butter. Stick margarine. Lard. Shortening. Ghee. Bacon fat. Tropical oils, such as coconut, palm kernel, or palm oil. ?Seasonings and condiments ?Onion salt, garlic salt, seasoned salt, table salt, and sea salt. Worcestershire sauce. Tartar sauce. Barbecue sauce. Teriyaki sauce. Soy sauce, including reduced-sodium. Steak sauce. Canned and packaged gravies. Fish sauce. Oyster sauce. Cocktail sauce. Store-bought horseradish. Ketchup. Mustard. Meat flavorings and tenderizers. Bouillon cubes. Hot sauces. Pre-made or packaged marinades. Pre-made or packaged taco seasonings. Relishes. Regular salad dressings. ?Other foods ?Salted popcorn and pretzels. ?The items listed above may not be a complete list of foods and beverages you should avoid. Contact a dietitian for more information. ?Where to find more information ?National Heart, Lung, and Blood Institute: https://wilson-eaton.com/ ?American Heart Association: www.heart.org ?Academy of Nutrition and Dietetics: www.eatright.org ?Plain: www.kidney.org ?Summary ?The DASH eating plan is a healthy eating plan that has been shown to reduce high blood pressure (hypertension). It may also reduce your risk for type 2 diabetes, heart disease, and stroke. ?When on the DASH eating plan, aim to eat more fresh fruits  and vegetables, whole grains, lean proteins, low-fat dairy, and heart-healthy fats. ?With the DASH eating plan, you should limit salt (sodium) intake to 2,300 mg a day. If you have hypertension, you may need to reduce your sodium intake to 1,500 mg a day. ?Work with your health care provider or  dietitian to adjust your eating plan to your individual calorie needs. ?This information is not intended to replace advice given to you by your health care provider. Make sure you discuss any questions you have with your hea

## 2021-09-05 ENCOUNTER — Encounter: Payer: Self-pay | Admitting: Nurse Practitioner

## 2021-09-05 ENCOUNTER — Ambulatory Visit: Payer: Medicare PPO | Admitting: Nurse Practitioner

## 2021-09-05 VITALS — BP 131/67 | HR 52 | Temp 98.9°F | Ht <= 58 in | Wt 115.4 lb

## 2021-09-05 DIAGNOSIS — I10 Essential (primary) hypertension: Secondary | ICD-10-CM | POA: Diagnosis not present

## 2021-09-05 DIAGNOSIS — D508 Other iron deficiency anemias: Secondary | ICD-10-CM

## 2021-09-05 DIAGNOSIS — M8588 Other specified disorders of bone density and structure, other site: Secondary | ICD-10-CM | POA: Diagnosis not present

## 2021-09-05 DIAGNOSIS — E782 Mixed hyperlipidemia: Secondary | ICD-10-CM | POA: Diagnosis not present

## 2021-09-05 MED ORDER — AMLODIPINE BESYLATE 10 MG PO TABS: ORAL_TABLET | ORAL | 4 refills | Status: DC

## 2021-09-05 NOTE — Assessment & Plan Note (Addendum)
Chronic, stable with daily iron intake.  Check CBC today, recent iron levels stable.  Continue current supplement.  Return in 6 months. ?

## 2021-09-05 NOTE — Progress Notes (Signed)
? ?BP 131/67   Pulse (!) 52   Temp 98.9 ?F (37.2 ?C) (Oral)   Ht 4' 10"  (1.473 m)   Wt 115 lb 6.4 oz (52.3 kg)   LMP  (LMP Unknown)   SpO2 96%   BMI 24.12 kg/m?   ? ?Subjective:  ? ? Patient ID: Judith Hickman, female    DOB: 1934-01-13, 88 y.o.   MRN: [id number] ? ?HPI: ?Judith Hickman is a 88 y.o. female ? ?Chief Complaint  ?Patient presents with  ? Hyperlipidemia  ? Hypertension  ? Osteopenia  ? Cough  ?  Patient states she has a dry cough that comes and goes. Patient states she thinks it may be coming from her blood pressure medication. Patient denies feeling sick.   ? ?COUGH ?Over past two weeks, has had more runny nose. Not currently taking anything for allergies. Never was a smoker or drank alcohol.  ?Duration: weeks ?Circumstances of initial development of cough: unknown ?Cough severity: mild ?Cough description: non productive ?Aggravating factors: most in the morning or goes outside ?Alleviating factors: nothing ?Status:  stable ?Treatments attempted: nothing ?Wheezing: no ?Shortness of breath: no ?Chest pain: no ?Chest tightness:no ?Nasal congestion: no ?Runny nose: yes ?Postnasal drip: yes ?Frequent throat clearing or swallowing: yes ?Hemoptysis: no ?Fevers: no ?Night sweats: no ?Weight loss: no ?Heartburn: no ?Recent foreign travel: no ?Tuberculosis contacts: no  ? ?HYPERTENSION / HYPERLIPIDEMIA ?Continues on Amlodipine, Metoprolol XL, and HCTZ.  No current statin. ?Satisfied with current treatment? yes ?Duration of hypertension: chronic ?BP monitoring frequency: daily ?BP range: 120-130/70 range ?BP medication side effects: no ?Duration of hyperlipidemia: chronic ?Aspirin: no ?Recent stressors: no ?Recurrent headaches: no ?Visual changes: no ?Palpitations: no ?Dyspnea: no ?Chest pain: no ?Lower extremity edema: no ?Dizzy/lightheaded: no  ?The ASCVD Risk score (Arnett DK, et al., 2019) failed to calculate for the following reasons: ?  The 2019 ASCVD risk score is only valid for ages 52 to  31 ? ?OSTEOPENIA ?On scan 01/01/18 noting osteopenia with T-score -2.0.  Continues Vit D and Evista. ?Satisfied with current treatment?: yes ?Medication side effects: no ?Medication compliance: good compliance ?Past osteoporosis medications/treatments: Evista ?Adequate calcium & vitamin D: yes ?Intolerance to bisphosphonates:no ?Weight bearing exercises: yes ? ?ANEMIA ?Takes iron daily for history of low iron levels.  Recent labs stable. ?Anemia status: stable ?Etiology of anemia: iron deficiency ?Duration of anemia treatment: many years ?Compliance with treatment: good compliance ?Iron supplementation side effects: no ?Severity of anemia: mild ?Fatigue: no ?Decreased exercise tolerance: no  ?Dyspnea on exertion: no ?Palpitations: no ?Bleeding: no ?Pica: no  ? ?Relevant past medical, surgical, family and social history reviewed and updated as indicated. Interim medical history since our last visit reviewed. ?Allergies and medications reviewed and updated. ? ?Review of Systems  ?Constitutional:  Negative for activity change, appetite change, diaphoresis, fatigue and fever.  ?Respiratory:  Negative for cough, chest tightness and shortness of breath.   ?Cardiovascular:  Negative for chest pain, palpitations and leg swelling.  ?Gastrointestinal: Negative.   ?Neurological: Negative.   ?Psychiatric/Behavioral: Negative.    ? ?Per HPI unless specifically indicated above ? ?   ?Objective:  ?  ?BP 131/67   Pulse (!) 52   Temp 98.9 ?F (37.2 ?C) (Oral)   Ht 4' 10"  (1.473 m)   Wt 115 lb 6.4 oz (52.3 kg)   LMP  (LMP Unknown)   SpO2 96%   BMI 24.12 kg/m?   ?Wt Readings from Last 3 Encounters:  ?09/05/21 115 lb 6.4  oz (52.3 kg)  ?12/28/20 123 lb 12.8 oz (56.2 kg)  ?10/10/20 122 lb (55.3 kg)  ?  ?Physical Exam ?Vitals and nursing note reviewed.  ?Constitutional:   ?   General: She is awake. She is not in acute distress. ?   Appearance: She is well-developed and well-groomed. She is not ill-appearing or toxic-appearing.   ?HENT:  ?   Head: Normocephalic.  ?   Right Ear: Hearing normal.  ?   Left Ear: Hearing normal.  ?Eyes:  ?   General: Lids are normal.     ?   Right eye: No discharge.     ?   Left eye: No discharge.  ?   Conjunctiva/sclera: Conjunctivae normal.  ?   Pupils: Pupils are equal, round, and reactive to light.  ?Neck:  ?   Thyroid: No thyromegaly.  ?   Vascular: No carotid bruit.  ?Cardiovascular:  ?   Rate and Rhythm: Normal rate and regular rhythm.  ?   Heart sounds: Normal heart sounds. No murmur heard. ?  No gallop.  ?Pulmonary:  ?   Effort: Pulmonary effort is normal.  ?   Breath sounds: Normal breath sounds.  ?Abdominal:  ?   General: Bowel sounds are normal.  ?   Palpations: Abdomen is soft.  ?Musculoskeletal:  ?   Cervical back: Normal range of motion and neck supple.  ?   Right lower leg: No edema.  ?   Left lower leg: No edema.  ?Lymphadenopathy:  ?   Cervical: No cervical adenopathy.  ?Skin: ?   General: Skin is warm and dry.  ?Neurological:  ?   Mental Status: She is alert and oriented to person, place, and time.  ?   Deep Tendon Reflexes: Reflexes are normal and symmetric.  ?   Reflex Scores: ?     Brachioradialis reflexes are 2+ on the right side and 2+ on the left side. ?     Patellar reflexes are 2+ on the right side and 2+ on the left side. ?Psychiatric:     ?   Attention and Perception: Attention normal.     ?   Mood and Affect: Mood normal.     ?   Speech: Speech normal.     ?   Behavior: Behavior normal. Behavior is cooperative.     ?   Thought Content: Thought content normal.  ? ?Results for orders placed or performed in visit on 12/28/20  ?Lipid Panel w/o Chol/HDL Ratio  ?Result Value Ref Range  ? Cholesterol, Total 234 (H) 100 - 199 mg/dL  ? Triglycerides 258 (H) 0 - 149 mg/dL  ? HDL 55 >39 mg/dL  ? VLDL Cholesterol Cal 46 (H) 5 - 40 mg/dL  ? LDL Chol Calc (NIH) 133 (H) 0 - 99 mg/dL  ?Basic metabolic panel  ?Result Value Ref Range  ? Glucose 99 65 - 99 mg/dL  ? BUN 24 8 - 27 mg/dL  ? Creatinine,  Ser 0.72 0.57 - 1.00 mg/dL  ? eGFR 82 >59 mL/min/1.73  ? BUN/Creatinine Ratio 33 (H) 12 - 28  ? Sodium 140 134 - 144 mmol/L  ? Potassium 4.1 3.5 - 5.2 mmol/L  ? Chloride 102 96 - 106 mmol/L  ? CO2 23 20 - 29 mmol/L  ? Calcium 9.8 8.7 - 10.3 mg/dL  ?Iron, TIBC and Ferritin Panel  ?Result Value Ref Range  ? Total Iron Binding Capacity 302 250 - 450 ug/dL  ? UIBC 218 118 - 369  ug/dL  ? Iron 84 27 - 139 ug/dL  ? Iron Saturation 28 15 - 55 %  ? Ferritin 204 (H) 15 - 150 ng/mL  ?CBC with Differential/Platelet  ?Result Value Ref Range  ? WBC 5.7 3.4 - 10.8 x10E3/uL  ? RBC 4.35 3.77 - 5.28 x10E6/uL  ? Hemoglobin 13.1 11.1 - 15.9 g/dL  ? Hematocrit 39.8 34.0 - 46.6 %  ? MCV 92 79 - 97 fL  ? MCH 30.1 26.6 - 33.0 pg  ? MCHC 32.9 31.5 - 35.7 g/dL  ? RDW 11.8 11.7 - 15.4 %  ? Platelets 258 150 - 450 x10E3/uL  ? Neutrophils 51 Not Estab. %  ? Lymphs 40 Not Estab. %  ? Monocytes 5 Not Estab. %  ? Eos 3 Not Estab. %  ? Basos 1 Not Estab. %  ? Neutrophils Absolute 2.9 1.4 - 7.0 x10E3/uL  ? Lymphocytes Absolute 2.3 0.7 - 3.1 x10E3/uL  ? Monocytes Absolute 0.3 0.1 - 0.9 x10E3/uL  ? EOS (ABSOLUTE) 0.2 0.0 - 0.4 x10E3/uL  ? Basophils Absolute 0.0 0.0 - 0.2 x10E3/uL  ? Immature Granulocytes 0 Not Estab. %  ? Immature Grans (Abs) 0.0 0.0 - 0.1 x10E3/uL  ?VITAMIN D 25 Hydroxy (Vit-D Deficiency, Fractures)  ?Result Value Ref Range  ? Vit D, 25-Hydroxy 42.6 30.0 - 100.0 ng/mL  ? ?   ?Assessment & Plan:  ? ?Problem List Items Addressed This Visit   ? ?  ? Cardiovascular and Mediastinum  ? Essential hypertension, benign - Primary  ?  Chronic, stable with BP at goal for age in office and at home.  Continue current medication regimen and adjust as needed.  Recommend she notify provider if BP ever consistently runs lower at home or has dizzy spells, then may reduce medication.  Continue to monitor BP at home and focus on DASH diet.  CMP, CBC, TSH today. Return in 6 months. ?  ?  ? Relevant Medications  ? amLODipine (NORVASC) 10 MG tablet  ? Other  Relevant Orders  ? CBC with Differential/Platelet  ? Comprehensive metabolic panel  ? TSH  ?  ? Musculoskeletal and Integument  ? Osteopenia  ?  Ongoing.  Bone density -2.0.  Repeat in July 2024.  Taking Evista, continue this

## 2021-09-05 NOTE — Assessment & Plan Note (Signed)
Chronic, no current statin.  Refuses to initiate, which is appropriate at this time based on age.  Lipid panel today and continue diet focus at home. 

## 2021-09-05 NOTE — Assessment & Plan Note (Addendum)
Ongoing.  Bone density -2.0.  Repeat in July 2024.  Taking Evista, continue this and Vitamin D.  Recheck Vit D level today. ?

## 2021-09-05 NOTE — Assessment & Plan Note (Signed)
Chronic, stable with BP at goal for age in office and at home.  Continue current medication regimen and adjust as needed.  Recommend she notify provider if BP ever consistently runs lower at home or has dizzy spells, then may reduce medication.  Continue to monitor BP at home and focus on DASH diet.  CMP, CBC, TSH today. Return in 6 months. ?

## 2021-09-06 LAB — CBC WITH DIFFERENTIAL/PLATELET
Basophils Absolute: 0 10*3/uL (ref 0.0–0.2)
Basos: 1 %
EOS (ABSOLUTE): 0.2 10*3/uL (ref 0.0–0.4)
Eos: 3 %
Hematocrit: 40.1 % (ref 34.0–46.6)
Hemoglobin: 13.1 g/dL (ref 11.1–15.9)
Immature Grans (Abs): 0 10*3/uL (ref 0.0–0.1)
Immature Granulocytes: 0 %
Lymphocytes Absolute: 1.9 10*3/uL (ref 0.7–3.1)
Lymphs: 36 %
MCH: 29.7 pg (ref 26.6–33.0)
MCHC: 32.7 g/dL (ref 31.5–35.7)
MCV: 91 fL (ref 79–97)
Monocytes Absolute: 0.3 10*3/uL (ref 0.1–0.9)
Monocytes: 5 %
Neutrophils Absolute: 3 10*3/uL (ref 1.4–7.0)
Neutrophils: 55 %
Platelets: 375 10*3/uL (ref 150–450)
RBC: 4.41 x10E6/uL (ref 3.77–5.28)
RDW: 12.5 % (ref 11.7–15.4)
WBC: 5.4 10*3/uL (ref 3.4–10.8)

## 2021-09-06 LAB — COMPREHENSIVE METABOLIC PANEL
ALT: 16 IU/L (ref 0–32)
AST: 19 IU/L (ref 0–40)
Albumin/Globulin Ratio: 1.7 (ref 1.2–2.2)
Albumin: 4.3 g/dL (ref 3.6–4.6)
Alkaline Phosphatase: 77 IU/L (ref 44–121)
BUN/Creatinine Ratio: 20 (ref 12–28)
BUN: 18 mg/dL (ref 8–27)
Bilirubin Total: <0.2 mg/dL (ref 0.0–1.2)
CO2: 22 mmol/L (ref 20–29)
Calcium: 9.9 mg/dL (ref 8.7–10.3)
Chloride: 101 mmol/L (ref 96–106)
Creatinine, Ser: 0.89 mg/dL (ref 0.57–1.00)
Globulin, Total: 2.6 g/dL (ref 1.5–4.5)
Glucose: 106 mg/dL — ABNORMAL HIGH (ref 70–99)
Potassium: 4.7 mmol/L (ref 3.5–5.2)
Sodium: 138 mmol/L (ref 134–144)
Total Protein: 6.9 g/dL (ref 6.0–8.5)
eGFR: 63 mL/min/{1.73_m2} (ref >59)

## 2021-09-06 LAB — LIPID PANEL W/O CHOL/HDL RATIO
Cholesterol, Total: 210 mg/dL — ABNORMAL HIGH (ref 100–199)
HDL: 57 mg/dL (ref >39)
LDL Chol Calc (NIH): 111 mg/dL — ABNORMAL HIGH (ref 0–99)
Triglycerides: 247 mg/dL — ABNORMAL HIGH (ref 0–149)
VLDL Cholesterol Cal: 42 mg/dL — ABNORMAL HIGH (ref 5–40)

## 2021-09-06 LAB — TSH: TSH: 1.08 u[IU]/mL (ref 0.450–4.500)

## 2021-09-06 LAB — VITAMIN D 25 HYDROXY (VIT D DEFICIENCY, FRACTURES): Vit D, 25-Hydroxy: 44.1 ng/mL (ref 30.0–100.0)

## 2021-09-06 NOTE — Progress Notes (Signed)
Good morning, please let Judith Hickman know her labs have returned and overall remain stable with exception of elevation in cholesterol levels, however at 87 I do not recommend starting medication for this, but recommend maintaining diet focus and regular activity.  Continue all current medications.  Any questions? ?Keep being amazing!!  Thank you for allowing me to participate in your care.  I appreciate you. ?Kindest regards, ?Kieli Golladay ?

## 2021-10-13 ENCOUNTER — Ambulatory Visit: Payer: Medicare PPO

## 2021-10-16 ENCOUNTER — Ambulatory Visit (INDEPENDENT_AMBULATORY_CARE_PROVIDER_SITE_OTHER): Payer: Medicare PPO | Admitting: *Deleted

## 2021-10-16 DIAGNOSIS — Z0000 Encounter for general adult medical examination without abnormal findings: Secondary | ICD-10-CM

## 2021-10-16 NOTE — Progress Notes (Signed)
? ?Subjective:  ? Judith Hickman is a 88 y.o. female who presents for Medicare Annual (Subsequent) preventive examination. ? ?I connected with  Judith Hickman on 10/16/21 by a telephone enabled telemedicine application and verified that I am speaking with the correct person using two identifiers. ?  ?I discussed the limitations of evaluation and management by telemedicine. The patient expressed understanding and agreed to proceed. ? ?Patient location: home ? ?Provider location: Tele-Health-home ? ? ? ?Review of Systems    ? ?Cardiac Risk Factors include: advanced age (>50men, >48 women);hypertension;family history of premature cardiovascular disease ? ?   ?Objective:  ?  ?Today's Vitals  ? ?There is no height or weight on file to calculate BMI. ? ? ?  10/16/2021  ? 11:14 AM 10/10/2020  ?  1:00 PM 09/24/2018  ? 11:39 AM 09/18/2017  ?  1:19 PM 08/21/2016  ?  1:05 PM 07/27/2015  ?  2:22 PM 07/27/2015  ?  2:04 PM  ?Advanced Directives  ?Does Patient Have a Medical Advance Directive? No Yes Yes Yes No No No  ?Type of Special educational needs teacher of [employer] Power of [place];Living will Healthcare Power of Attorney     ?Copy of Healthcare Power of Attorney in Chart?   Yes - validated most recent copy scanned in chart (See row information) Yes     ?Would patient like information on creating a medical advance directive? No - Patient declined    No - Patient declined Yes - Educational materials given   ? ? ?Current Medications (verified) ?Outpatient Encounter Medications as of 10/16/2021  ?Medication Sig  ? amLODipine (NORVASC) 10 MG tablet TAKE 1 TABLET(10 MG) BY MOUTH DAILY  ? hydrochlorothiazide (MICROZIDE) 12.5 MG capsule TAKE 1 CAPSULE BY MOUTH EVERY DAY  ? metoprolol succinate (TOPROL-XL) 50 MG 24 hr tablet TAKE 1 TABLET BY MOUTH EVERY DAY WITH OR IMMEDIATELY FOLLOWING A MEAL  ? raloxifene (EVISTA) 60 MG tablet TAKE 1 TABLET(60 MG) BY MOUTH DAILY  ? VITAMIN D, CHOLECALCIFEROL, PO Take by mouth.   ? IRON PO  Take by mouth.   ? ?No facility-administered encounter medications on file as of 10/16/2021.  ? ? ?Allergies (verified) ?Lisinopril  ? ?History: ?Past Medical History:  ?Diagnosis Date  ? Back pain   ? Chronic kidney disease   ? Costalchondritis   ? Hyperlipidemia   ? Hypertension   ? Hypertensive CKD (chronic kidney disease)   ? Iron deficiency anemia   ? Mitral valve regurgitation   ? Osteoporosis   ? Rotator cuff syndrome of left shoulder   ? ?Past Surgical History:  ?Procedure Laterality Date  ? ABDOMINAL HYSTERECTOMY    ? BLADDER REPAIR    ? X 2  ? CARPAL TUNNEL RELEASE    ? ?Family History  ?Problem Relation Age of Onset  ? Diabetes Mother   ? Heart disease Father   ? ?Social History  ? ?Socioeconomic History  ? Marital status: Divorced  ?  Spouse name: Not on file  ? Number of children: Not on file  ? Years of education: Not on file  ? Highest education level: 9th grade  ?Occupational History  ? Not on file  ?Tobacco Use  ? Smoking status: Never  ? Smokeless tobacco: Never  ?Vaping Use  ? Vaping Use: Never used  ?Substance and Sexual Activity  ? Alcohol use: No  ?  Alcohol/week: 0.0 standard drinks  ? Drug use: No  ? Sexual activity: Not Currently  ?  Other Topics Concern  ? Not on file  ?Social History Narrative  ? Not on file  ? ?Social Determinants of Health  ? ?Financial Resource Strain: Low Risk   ? Difficulty of Paying Living Expenses: Not hard at all  ?Food Insecurity: No Food Insecurity  ? Worried About Programme researcher, broadcasting/film/video in the Last Year: Never true  ? Ran Out of Food in the Last Year: Never true  ?Transportation Needs: No Transportation Needs  ? Lack of Transportation (Medical): No  ? Lack of Transportation (Non-Medical): No  ?Physical Activity: Sufficiently Active  ? Days of Exercise per Week: 4 days  ? Minutes of Exercise per Session: 40 min  ?Stress: No Stress Concern Present  ? Feeling of Stress : Not at all  ?Social Connections: Moderately Isolated  ? Frequency of Communication with Friends and  Family: More than three times a week  ? Frequency of Social Gatherings with Friends and Family: Twice a week  ? Attends Religious Services: More than 4 times per year  ? Active Member of Clubs or Organizations: No  ? Attends Banker Meetings: Never  ? Marital Status: Divorced  ? ? ?Tobacco Counseling ?Counseling given: Not Answered ? ? ?Clinical Intake: ? ?Pre-visit preparation completed: Yes ? ?Pain : No/denies pain ? ?  ? ?Nutritional Risks: None ?Diabetes: No ? ?How often do you need to have someone help you when you read instructions, pamphlets, or other written materials from your doctor or pharmacy?: 1 - Never ? ?Diabetic? no ? ?Interpreter Needed?: No ? ?Information entered by :: Judith Haggard LPN ? ? ?Activities of Daily Living ? ?  10/16/2021  ? 11:19 AM  ?In your present state of health, do you have any difficulty performing the following activities:  ?Hearing? 0  ?Vision? 0  ?Difficulty concentrating or making decisions? 0  ?Walking or climbing stairs? 0  ?Dressing or bathing? 0  ?Doing errands, shopping? 0  ?Preparing Food and eating ? N  ?Using the Toilet? N  ?In the past six months, have you accidently leaked urine? N  ?Do you have problems with loss of bowel control? N  ?Managing your Medications? N  ?Managing your Finances? N  ?Housekeeping or managing your Housekeeping? N  ? ? ?Patient Care Team: ?Marjie Skiff, NP as PCP - General (Nurse Practitioner) ? ?Indicate any recent Medical Services you may have received from other than [facility] providers in the past year (date may be approximate). ? ?   ?Assessment:  ? This is a routine wellness examination for Judith Hickman. ? ?Hearing/Vision screen ?Hearing Screening - Comments:: Does not wear hearing aids ?Vision Screening - Comments:: Up to date ?Judith Hickman ? ?Dietary issues and exercise activities discussed: ?Current Exercise Habits: Home exercise routine, Type of exercise: walking;stretching, Time (Minutes): 40, Frequency  (Times/Week): 4, Weekly Exercise (Minutes/Week): 160, Intensity: Mild, Exercise limited by: None identified ? ? Goals Addressed   ? ?  ?  ?  ?  ? This Visit's Progress  ?  Patient Stated     ?  Continue current lifestyle  ?  ? ?  ? ?Depression Screen ? ?  10/16/2021  ? 11:23 AM 09/05/2021  ? 12:57 PM 10/10/2020  ?  1:01 PM 06/29/2020  ?  3:45 PM 10/07/2019  ?  1:05 PM 05/21/2019  ?  2:20 PM 09/24/2018  ? 11:36 AM  ?PHQ 2/9 Scores  ?PHQ - 2 Score 0 0 0 0 0 0 0  ?PHQ- 9 Score  0 0  0     ?  ?Fall Risk ? ?  10/16/2021  ? 11:15 AM 09/05/2021  ?  1:02 PM 10/10/2020  ?  1:01 PM 06/29/2020  ?  3:45 PM 10/07/2019  ?  1:03 PM  ?Fall Risk   ?Falls in the past year? 0 0 0 0 0  ?Number falls in past yr: 0 0   0  ?Injury with Fall? 0 0   0  ?Risk for fall due to :  No Fall Risks Medication side effect    ?Follow up Falls evaluation completed;Education provided;Falls prevention discussed Falls evaluation completed Falls evaluation completed;Education provided;Falls prevention discussed    ? ? ?FALL RISK PREVENTION PERTAINING TO THE HOME: ? ?Any stairs in or around the home? No  ?If so, are there any without handrails? No  ?Home free of loose throw rugs in walkways, pet beds, electrical cords, etc? Yes  ?Adequate lighting in your home to reduce risk of falls? Yes  ? ?ASSISTIVE DEVICES UTILIZED TO PREVENT FALLS: ? ?Life alert? No  ?Use of a cane, walker or w/c? No  ?Grab bars in the bathroom? Yes  ?Shower chair or bench in shower? No  ?Elevated toilet seat or a handicapped toilet? No  ? ?TIMED UP AND GO: ? ?Was the test performed? No .  ? ? ?Cognitive Function: ?  ?  ? ?  10/16/2021  ? 11:16 AM 10/10/2020  ?  1:02 PM 09/24/2018  ? 11:40 AM 09/18/2017  ?  1:19 PM  ?6CIT Screen  ?What Year? 0 points 0 points 0 points 0 points  ?What month? 0 points 0 points 0 points 0 points  ?What time? 0 points 0 points 0 points 0 points  ?Count back from 20 0 points 0 points 0 points 0 points  ?Months in reverse 0 points 0 points 0 points 0 points  ?Repeat phrase 0  points 2 points 0 points 0 points  ?Total Score 0 points 2 points 0 points 0 points  ? ? ?Immunizations ?Immunization History  ?Administered Date(s) Administered  ? Fluad Quad(high Dose 65+) 05/08/2020  ? Influenza,

## 2021-10-16 NOTE — Patient Instructions (Addendum)
Judith Hickman , ?Thank you for taking time to come for your Medicare Wellness Visit. I appreciate your ongoing commitment to your health goals. Please review the following plan we discussed and let me know if I can assist you in the future.  ? ?Screening recommendations/referrals: ?Colonoscopy: no longer required ?Mammogram: no longer required  ?Bone Density:  due 2024 ?Recommended yearly ophthalmology/optometry visit for glaucoma screening and checkup ?Recommended yearly dental visit for hygiene and checkup ? ?Vaccinations: ?Influenza vaccine: Education provided ?Pneumococcal vaccine: up to date ?Tdap vaccine: up to date ?Shingles vaccine: Education provided   ? ?Advanced directives: Education provided ? ?Conditions/risks identified:  ? ?Next appointment: 03-12-2022 @ 1:00  Judith Hickman ? ? ?Preventive Care 86 Years and Older, Female ?Preventive care refers to lifestyle choices and visits with your health care provider that can promote health and wellness. ?What does preventive care include? ?A yearly physical exam. This is also called an annual well check. ?Dental exams once or twice a year. ?Routine eye exams. Ask your health care provider how often you should have your eyes checked. ?Personal lifestyle choices, including: ?Daily care of your teeth and gums. ?Regular physical activity. ?Eating a healthy diet. ?Avoiding tobacco and drug use. ?Limiting alcohol use. ?Practicing safe sex. ?Taking low-dose aspirin every day. ?Taking vitamin and mineral supplements as recommended by your health care provider. ?What happens during an annual well check? ?The services and screenings done by your health care provider during your annual well check will depend on your age, overall health, lifestyle risk factors, and family history of disease. ?Counseling  ?Your health care provider may ask you questions about your: ?Alcohol use. ?Tobacco use. ?Drug use. ?Emotional well-being. ?Home and relationship well-being. ?Sexual  activity. ?Eating habits. ?History of falls. ?Memory and ability to understand (cognition). ?Work and work Astronomer. ?Reproductive health. ?Screening  ?You may have the following tests or measurements: ?Height, weight, and BMI. ?Blood pressure. ?Lipid and cholesterol levels. These may be checked every 5 years, or more frequently if you are over 65 years old. ?Skin check. ?Lung cancer screening. You may have this screening every year starting at age 37 if you have a 30-pack-year history of smoking and currently smoke or have quit within the past 15 years. ?Fecal occult blood test (FOBT) of the stool. You may have this test every year starting at age 18. ?Flexible sigmoidoscopy or colonoscopy. You may have a sigmoidoscopy every 5 years or a colonoscopy every 10 years starting at age 86. ?Hepatitis C blood test. ?Hepatitis B blood test. ?Sexually transmitted disease (STD) testing. ?Diabetes screening. This is done by checking your blood sugar (glucose) after you have not eaten for a while (fasting). You may have this done every 1-3 years. ?Bone density scan. This is done to screen for osteoporosis. You may have this done starting at age 22. ?Mammogram. This may be done every 1-2 years. Talk to your health care provider about how often you should have regular mammograms. ?Talk with your health care provider about your test results, treatment options, and if necessary, the need for more tests. ?Vaccines  ?Your health care provider may recommend certain vaccines, such as: ?Influenza vaccine. This is recommended every year. ?Tetanus, diphtheria, and acellular pertussis (Tdap, Td) vaccine. You may need a Td booster every 10 years. ?Zoster vaccine. You may need this after age 57. ?Pneumococcal 13-valent conjugate (PCV13) vaccine. One dose is recommended after age 29. ?Pneumococcal polysaccharide (PPSV23) vaccine. One dose is recommended after age 46. ?Talk to your health care  provider about which screenings and vaccines  you need and how often you need them. ?This information is not intended to replace advice given to you by your health care provider. Make sure you discuss any questions you have with your health care provider. ?Document Released: 06/17/2015 Document Revised: 02/08/2016 Document Reviewed: 03/22/2015 ?Elsevier Interactive Patient Education ? 2017 [place]. ? ?Fall Prevention in the Home ?Falls can cause injuries. They can happen to people of all ages. There are many things you can do to make your home safe and to help prevent falls. ?What can I do on the outside of my home? ?Regularly fix the edges of walkways and driveways and fix any cracks. ?Remove anything that might make you trip as you walk through a door, such as a raised step or threshold. ?Trim any bushes or trees on the path to your home. ?Use bright outdoor lighting. ?Clear any walking paths of anything that might make someone trip, such as rocks or tools. ?Regularly check to see if handrails are loose or broken. Make sure that both sides of any steps have handrails. ?Any raised decks and porches should have guardrails on the edges. ?Have any leaves, snow, or ice cleared regularly. ?Use sand or salt on walking paths during winter. ?Clean up any spills in your garage right away. This includes oil or grease spills. ?What can I do in the bathroom? ?Use night lights. ?Install grab bars by the toilet and in the tub and shower. Do not use towel bars as grab bars. ?Use non-skid mats or decals in the tub or shower. ?If you need to sit down in the shower, use a plastic, non-slip stool. ?Keep the floor dry. Clean up any water that spills on the floor as soon as it happens. ?Remove soap buildup in the tub or shower regularly. ?Attach bath mats securely with double-sided non-slip rug tape. ?Do not have throw rugs and other things on the floor that can make you trip. ?What can I do in the bedroom? ?Use night lights. ?Make sure that you have a light by your bed that  is easy to reach. ?Do not use any sheets or blankets that are too big for your bed. They should not hang down onto the floor. ?Have a firm chair that has side arms. You can use this for support while you get dressed. ?Do not have throw rugs and other things on the floor that can make you trip. ?What can I do in the kitchen? ?Clean up any spills right away. ?Avoid walking on wet floors. ?Keep items that you use a lot in easy-to-reach places. ?If you need to reach something above you, use a strong step stool that has a grab bar. ?Keep electrical cords out of the way. ?Do not use floor polish or wax that makes floors slippery. If you must use wax, use non-skid floor wax. ?Do not have throw rugs and other things on the floor that can make you trip. ?What can I do with my stairs? ?Do not leave any items on the stairs. ?Make sure that there are handrails on both sides of the stairs and use them. Fix handrails that are broken or loose. Make sure that handrails are as long as the stairways. ?Check any carpeting to make sure that it is firmly attached to the stairs. Fix any carpet that is loose or worn. ?Avoid having throw rugs at the top or bottom of the stairs. If you do have throw rugs, attach them to the  floor with carpet tape. ?Make sure that you have a light switch at the top of the stairs and the bottom of the stairs. If you do not have them, ask someone to add them for you. ?What else can I do to help prevent falls? ?Wear shoes that: ?Do not have high heels. ?Have rubber bottoms. ?Are comfortable and fit you well. ?Are closed at the toe. Do not wear sandals. ?If you use a stepladder: ?Make sure that it is fully opened. Do not climb a closed stepladder. ?Make sure that both sides of the stepladder are locked into place. ?Ask someone to hold it for you, if possible. ?Clearly mark and make sure that you can see: ?Any grab bars or handrails. ?First and last steps. ?Where the edge of each step is. ?Use tools that help you  move around (mobility aids) if they are needed. These include: ?Canes. ?Walkers. ?Scooters. ?Crutches. ?Turn on the lights when you go into a dark area. Replace any light bulbs as soon as they burn out. ?Set up your

## 2022-03-12 ENCOUNTER — Ambulatory Visit: Payer: Medicare PPO | Admitting: Nurse Practitioner

## 2022-03-12 DIAGNOSIS — E782 Mixed hyperlipidemia: Secondary | ICD-10-CM

## 2022-03-12 DIAGNOSIS — D508 Other iron deficiency anemias: Secondary | ICD-10-CM

## 2022-03-12 DIAGNOSIS — I10 Essential (primary) hypertension: Secondary | ICD-10-CM

## 2022-03-12 DIAGNOSIS — M8588 Other specified disorders of bone density and structure, other site: Secondary | ICD-10-CM

## 2022-03-12 DIAGNOSIS — Z23 Encounter for immunization: Secondary | ICD-10-CM

## 2022-03-25 NOTE — Patient Instructions (Signed)

## 2022-03-26 ENCOUNTER — Encounter: Payer: Self-pay | Admitting: Nurse Practitioner

## 2022-03-26 ENCOUNTER — Ambulatory Visit (INDEPENDENT_AMBULATORY_CARE_PROVIDER_SITE_OTHER): Payer: Medicare PPO | Admitting: Nurse Practitioner

## 2022-03-26 VITALS — BP 128/70 | HR 57 | Temp 98.3°F | Ht <= 58 in | Wt 117.9 lb

## 2022-03-26 DIAGNOSIS — Z23 Encounter for immunization: Secondary | ICD-10-CM | POA: Diagnosis not present

## 2022-03-26 DIAGNOSIS — D508 Other iron deficiency anemias: Secondary | ICD-10-CM

## 2022-03-26 DIAGNOSIS — E782 Mixed hyperlipidemia: Secondary | ICD-10-CM

## 2022-03-26 DIAGNOSIS — I10 Essential (primary) hypertension: Secondary | ICD-10-CM | POA: Diagnosis not present

## 2022-03-26 DIAGNOSIS — M8588 Other specified disorders of bone density and structure, other site: Secondary | ICD-10-CM

## 2022-03-26 LAB — MICROALBUMIN, URINE WAIVED
Creatinine, Urine Waived: 200 mg/dL (ref 10–300)
Microalb, Ur Waived: 30 mg/L — ABNORMAL HIGH (ref 0–19)
Microalb/Creat Ratio: <30 mg/g (ref <30)

## 2022-03-26 MED ORDER — METOPROLOL SUCCINATE ER 50 MG PO TB24: ORAL_TABLET | ORAL | 4 refills | Status: DC

## 2022-03-26 MED ORDER — RALOXIFENE HCL 60 MG PO TABS: ORAL_TABLET | ORAL | 4 refills | Status: DC

## 2022-03-26 MED ORDER — HYDROCHLOROTHIAZIDE 12.5 MG PO CAPS: ORAL_CAPSULE | ORAL | 4 refills | Status: DC

## 2022-03-26 NOTE — Progress Notes (Signed)
BP 128/70   Pulse (!) 57   Temp 98.3 F (36.8 C) (Oral)   Ht 4' 9.99" (1.473 m)   Wt 117 lb 14.4 oz (53.5 kg)   LMP  (LMP Unknown)   SpO2 93%   BMI 24.65 kg/m    Subjective:    Patient ID: Judith Hickman, female    DOB: 13-Nov-1933, 88 y.o.   MRN: [id number]  HPI: Judith Hickman is a 88 y.o. female  Chief Complaint  Patient presents with   Hypertension   HYPERTENSION / HYPERLIPIDEMIA Continues on Amlodipine, Metoprolol XL, and HCTZ.  No current statin, does not wish to start. Satisfied with current treatment? yes Duration of hypertension: chronic BP monitoring frequency: daily BP range: 120-130/70 range BP medication side effects: no Duration of hyperlipidemia: chronic Aspirin: no Recent stressors: no Recurrent headaches: no Visual changes: no Palpitations: no Dyspnea: no Chest pain: no Lower extremity edema: no Dizzy/lightheaded: no  The ASCVD Risk score (Arnett DK, et al., 2019) failed to calculate for the following reasons:   The 2019 ASCVD risk score is only valid for ages 40 to 26  OSTEOPENIA On scan 01/01/18 noting osteopenia with T-score -2.0.  Continues Vit D and Evista. Satisfied with current treatment?: yes Medication side effects: no Medication compliance: good compliance Past osteoporosis medications/treatments: Evista Adequate calcium & vitamin D: yes Intolerance to bisphosphonates:no Weight bearing exercises: yes  ANEMIA Takes iron daily for history of low iron levels.   Anemia status: stable Etiology of anemia: iron deficiency Duration of anemia treatment: many years Compliance with treatment: good compliance Iron supplementation side effects: no Severity of anemia: mild Fatigue: no Decreased exercise tolerance: no  Dyspnea on exertion: no Palpitations: no Bleeding: no Pica: no   Relevant past medical, surgical, family and social history reviewed and updated as indicated. Interim medical history since our last visit  reviewed. Allergies and medications reviewed and updated.  Review of Systems  Constitutional:  Negative for activity change, appetite change, diaphoresis, fatigue and fever.  Respiratory:  Negative for cough, chest tightness and shortness of breath.   Cardiovascular:  Negative for chest pain, palpitations and leg swelling.  Gastrointestinal: Negative.   Neurological: Negative.   Psychiatric/Behavioral: Negative.      Per HPI unless specifically indicated above     Objective:    BP 128/70   Pulse (!) 57   Temp 98.3 F (36.8 C) (Oral)   Ht 4' 9.99" (1.473 m)   Wt 117 lb 14.4 oz (53.5 kg)   LMP  (LMP Unknown)   SpO2 93%   BMI 24.65 kg/m   Wt Readings from Last 3 Encounters:  03/26/22 117 lb 14.4 oz (53.5 kg)  09/05/21 115 lb 6.4 oz (52.3 kg)  12/28/20 123 lb 12.8 oz (56.2 kg)    Physical Exam Vitals and nursing note reviewed.  Constitutional:      General: She is awake. She is not in acute distress.    Appearance: She is well-developed and well-groomed. She is not ill-appearing or toxic-appearing.  HENT:     Head: Normocephalic.     Right Ear: Hearing normal.     Left Ear: Hearing normal.  Eyes:     General: Lids are normal.        Right eye: No discharge.        Left eye: No discharge.     Conjunctiva/sclera: Conjunctivae normal.     Pupils: Pupils are equal, round, and reactive to light.  Neck:  Thyroid: No thyromegaly.     Vascular: No carotid bruit.  Cardiovascular:     Rate and Rhythm: Normal rate and regular rhythm.     Heart sounds: Normal heart sounds. No murmur heard.    No gallop.  Pulmonary:     Effort: Pulmonary effort is normal.     Breath sounds: Normal breath sounds.  Abdominal:     General: Bowel sounds are normal.     Palpations: Abdomen is soft.  Musculoskeletal:     Cervical back: Normal range of motion and neck supple.     Right lower leg: No edema.     Left lower leg: No edema.  Lymphadenopathy:     Cervical: No cervical  adenopathy.  Skin:    General: Skin is warm and dry.  Neurological:     Mental Status: She is alert and oriented to person, place, and time.     Deep Tendon Reflexes: Reflexes are normal and symmetric.     Reflex Scores:      Brachioradialis reflexes are 2+ on the right side and 2+ on the left side.      Patellar reflexes are 2+ on the right side and 2+ on the left side. Psychiatric:        Attention and Perception: Attention normal.        Mood and Affect: Mood normal.        Speech: Speech normal.        Behavior: Behavior normal. Behavior is cooperative.        Thought Content: Thought content normal.    Results for orders placed or performed in visit on 09/05/21  CBC with Differential/Platelet  Result Value Ref Range   WBC 5.4 3.4 - 10.8 x10E3/uL   RBC 4.41 3.77 - 5.28 x10E6/uL   Hemoglobin 13.1 11.1 - 15.9 g/dL   Hematocrit 40.1 34.0 - 46.6 %   MCV 91 79 - 97 fL   MCH 29.7 26.6 - 33.0 pg   MCHC 32.7 31.5 - 35.7 g/dL   RDW 12.5 11.7 - 15.4 %   Platelets 375 150 - 450 x10E3/uL   Neutrophils 55 Not Estab. %   Lymphs 36 Not Estab. %   Monocytes 5 Not Estab. %   Eos 3 Not Estab. %   Basos 1 Not Estab. %   Neutrophils Absolute 3.0 1.4 - 7.0 x10E3/uL   Lymphocytes Absolute 1.9 0.7 - 3.1 x10E3/uL   Monocytes Absolute 0.3 0.1 - 0.9 x10E3/uL   EOS (ABSOLUTE) 0.2 0.0 - 0.4 x10E3/uL   Basophils Absolute 0.0 0.0 - 0.2 x10E3/uL   Immature Granulocytes 0 Not Estab. %   Immature Grans (Abs) 0.0 0.0 - 0.1 x10E3/uL  Comprehensive metabolic panel  Result Value Ref Range   Glucose 106 (H) 70 - 99 mg/dL   BUN 18 8 - 27 mg/dL   Creatinine, Ser 0.89 0.57 - 1.00 mg/dL   eGFR 63 >59 mL/min/1.73   BUN/Creatinine Ratio 20 12 - 28   Sodium 138 134 - 144 mmol/L   Potassium 4.7 3.5 - 5.2 mmol/L   Chloride 101 96 - 106 mmol/L   CO2 22 20 - 29 mmol/L   Calcium 9.9 8.7 - 10.3 mg/dL   Total Protein 6.9 6.0 - 8.5 g/dL   Albumin 4.3 3.6 - 4.6 g/dL   Globulin, Total 2.6 1.5 - 4.5 g/dL    Albumin/Globulin Ratio 1.7 1.2 - 2.2   Bilirubin Total <0.2 0.0 - 1.2 mg/dL   Alkaline Phosphatase 77  44 - 121 IU/L   AST 19 0 - 40 IU/L   ALT 16 0 - 32 IU/L  Lipid Panel w/o Chol/HDL Ratio  Result Value Ref Range   Cholesterol, Total 210 (H) 100 - 199 mg/dL   Triglycerides 247 (H) 0 - 149 mg/dL   HDL 57 >39 mg/dL   VLDL Cholesterol Cal 42 (H) 5 - 40 mg/dL   LDL Chol Calc (NIH) 111 (H) 0 - 99 mg/dL  TSH  Result Value Ref Range   TSH 1.080 0.450 - 4.500 uIU/mL  VITAMIN D 25 Hydroxy (Vit-D Deficiency, Fractures)  Result Value Ref Range   Vit D, 25-Hydroxy 44.1 30.0 - 100.0 ng/mL      Assessment & Plan:   Problem List Items Addressed This Visit       Cardiovascular and Mediastinum   Essential hypertension, benign - Primary    Chronic, stable.  BP at goal in office and on home readings.  Continue current medication regimen and adjust as needed.  Recommend she notify provider if BP ever consistently runs lower at home or has dizzy spells, then may reduce medication.  Continue to monitor BP at home and focus on DASH diet.  LABS: BMP and urine ALB today. Return in 6 months.      Relevant Medications   hydrochlorothiazide (MICROZIDE) 12.5 MG capsule   metoprolol succinate (TOPROL-XL) 50 MG 24 hr tablet   Other Relevant Orders   Basic metabolic panel   Microalbumin, Urine Waived     Musculoskeletal and Integument   Osteopenia    Ongoing.  Bone density -2.0.  Repeat in July 2024.  Taking Evista, continue this and Vitamin D.  Recheck Vit D level next visit.        Other   Fe deficiency anemia    Chronic, stable with daily iron intake.  Check CBC next visit, recent iron levels stable.  Continue current supplement.  Return in 6 months.      Hyperlipidemia    Chronic, no current statin.  Refuses to initiate, which is appropriate at this time based on age.  Lipid panel next visit and continue diet focus at home.      Relevant Medications   hydrochlorothiazide (MICROZIDE) 12.5 MG  capsule   metoprolol succinate (TOPROL-XL) 50 MG 24 hr tablet   Other Visit Diagnoses     Need for influenza vaccination       Flu vaccine in office today.   Relevant Orders   Flu Vaccine QUAD High Dose(Fluad) (Completed)        Follow up plan: Return in about 6 months (around 09/25/2022) for HTN/HLD, OSTEOPENIA, ANEMIA.

## 2022-03-26 NOTE — Assessment & Plan Note (Signed)
Chronic, stable.  BP at goal in office and on home readings.  Continue current medication regimen and adjust as needed.  Recommend she notify provider if BP ever consistently runs lower at home or has dizzy spells, then may reduce medication.  Continue to monitor BP at home and focus on DASH diet.  LABS: BMP and urine ALB today. Return in 6 months.

## 2022-03-26 NOTE — Assessment & Plan Note (Signed)
Chronic, stable with daily iron intake.  Check CBC next visit, recent iron levels stable.  Continue current supplement.  Return in 6 months.

## 2022-03-26 NOTE — Assessment & Plan Note (Signed)
Chronic, no current statin.  Refuses to initiate, which is appropriate at this time based on age.  Lipid panel next visit and continue diet focus at home.

## 2022-03-26 NOTE — Assessment & Plan Note (Signed)
Ongoing.  Bone density -2.0.  Repeat in July 2024.  Taking Evista, continue this and Vitamin D.  Recheck Vit D level next visit.

## 2022-03-28 LAB — BASIC METABOLIC PANEL
BUN/Creatinine Ratio: 25 (ref 12–28)
BUN: 21 mg/dL (ref 8–27)
CO2: 23 mmol/L (ref 20–29)
Calcium: 9.7 mg/dL (ref 8.7–10.3)
Chloride: 101 mmol/L (ref 96–106)
Creatinine, Ser: 0.84 mg/dL (ref 0.57–1.00)
Glucose: 103 mg/dL — ABNORMAL HIGH (ref 70–99)
Potassium: 4.3 mmol/L (ref 3.5–5.2)
Sodium: 143 mmol/L (ref 134–144)
eGFR: 68 mL/min/{1.73_m2} (ref >59)

## 2022-03-28 NOTE — Progress Notes (Signed)
Good afternoon, please let Judith Hickman know labs have returned and overall remain stable.  No medication changes needed.  Have a wonderful day!! Keep being amazing!!  Thank you for allowing me to participate in your care.  I appreciate you. Kindest regards, Jarrell Armond

## 2022-09-23 NOTE — Patient Instructions (Incomplete)
Please call to schedule your bone density for after 01/02/2023: [facility] at [facility]  Address: [address], [place], [place] [address] Phone: [phone]  [facility] at [facility] [address] [place],  [place]  [address] Phone: [phone]     Be Involved in Your Health Care:  Taking Medications When medications are taken as directed, they can greatly improve your health. But if they are not taken as instructed, they may not work. In some cases, not taking them correctly can be harmful. To help ensure your treatment remains effective and safe, understand your medications and how to take them.  Your lab results, notes and after visit summary will be available on My Chart. We strongly encourage you to use this feature. If lab results are abnormal the clinic will contact you with the appropriate steps. If the clinic does not contact you assume the results are satisfactory. You can always see your results on My Chart. If you have questions regarding your condition, please contact the clinic during office hours. You can also ask questions on My Chart.  We at [facility] are grateful that you chose [place] to provide care. We strive to provide excellent and compassionate care and are always looking for feedback. If you get a survey from the clinic please complete this.    Osteopenia  Osteopenia is a loss of thickness (density) inside the bones. Another name for osteopenia is low bone mass. Mild osteopenia is a normal part of aging. It is not a disease, and it does not cause symptoms. However, if you have osteopenia and continue to lose bone mass, you could develop a condition that causes the bones to become thin and break more easily (osteoporosis). Osteoporosis can cause you to lose some height, have back pain, and have a stooped posture. Although osteopenia is not a disease, making changes to your lifestyle and diet can help to  prevent osteopenia from developing into osteoporosis. What are the causes? Osteopenia is caused by loss of calcium in the bones. Bones are constantly changing. Old bone cells are continually being replaced with new bone cells. This process builds new bone. The mineral calcium is needed to build new bone and maintain bone density. Bone density is usually highest around age 78. After that, most people's bodies cannot replace all the bone they have lost with new bone. What increases the risk? You are more likely to develop this condition if: You are older than age 19. You are a woman who went through menopause early. You have a long illness that keeps you in bed. You do not get enough exercise. You lack certain nutrients (malnutrition). You have an overactive thyroid gland (hyperthyroidism). You use products that contain nicotine or tobacco, such as cigarettes, e-cigarettes and chewing tobacco, or you drink a lot of alcohol. You are taking medicines that weaken the bones, such as steroids. What are the signs or symptoms? This condition does not cause any symptoms. You may have a slightly higher risk for bone breaks (fractures), so getting fractures more easily than normal may be an indication of osteopenia. How is this diagnosed? This condition may be diagnosed based on an X-ray exam that measures bone density (dual-energy X-ray absorptiometry, or DEXA). This test can measure bone density in your hips, spine, and wrists. Osteopenia has no symptoms, so this condition is usually diagnosed after a routine bone density screening test is done for osteoporosis. This routine screening is usually  done for: Women who are age 40 or older. Men who are age 48 or older. If you have risk factors for osteopenia, you may have the screening test at an earlier age. How is this treated? Making dietary and lifestyle changes can lower your risk for osteoporosis. If you have severe osteopenia that is close to  becoming osteoporosis, this condition can be treated with medicines and dietary supplements such as calcium and vitamin D. These supplements help to rebuild bone density. Follow these instructions at home: Eating and drinking Eat a diet that is high in calcium and vitamin D. Calcium is found in dairy products, beans, salmon, and leafy green vegetables like spinach and broccoli. Look for foods that have vitamin D and calcium added to them (fortified foods), such as orange juice, cereal, and bread.  Lifestyle Do 30 minutes or more of a weight-bearing exercise every day, such as walking, jogging, or playing a sport. These types of exercises strengthen the bones. Do not use any products that contain nicotine or tobacco, such as cigarettes, e-cigarettes, and chewing tobacco. If you need help quitting, ask your health care provider. Do not drink alcohol if: Your health care provider tells you not to drink. You are pregnant, may be pregnant, or are planning to become pregnant. If you drink alcohol: Limit how much you use to: 0-1 drink a day for women. 0-2 drinks a day for men. Be aware of how much alcohol is in your drink. In the [place] one drink equals one 12 oz bottle of beer (355 mL), one 5 oz glass of wine (148 mL), or one 1 oz glass of hard liquor (44 mL). General instructions Take over-the-counter and prescription medicines only as told by your health care provider. These include vitamins and supplements. Take precautions at home to lower your risk of falling, such as: Keeping rooms well-lit and free of clutter, such as cords. Installing safety rails on stairs. Using rubber mats in the bathroom or other areas that are often wet or slippery. Keep all follow-up visits. This is important. Contact a health care provider if: You have not had a bone density screening for osteoporosis and you are: A woman who is age 14 or older. A man who is age 61 or older. You are a postmenopausal woman who  has not had a bone density screening for osteoporosis. You are older than age 33 and you want to know if you should have bone density screening for osteoporosis. Summary Osteopenia is a loss of thickness (density) inside the bones. Another name for osteopenia is low bone mass. Osteopenia is not a disease, but it may increase your risk for a condition that causes the bones to become thin and break more easily (osteoporosis). You may be at risk for osteopenia if you are older than age 79 or if you are a woman who went through early menopause. Osteopenia does not cause any symptoms, but it can be diagnosed with a bone density screening test. Dietary and lifestyle changes are the first treatment for osteopenia. These may lower your risk for osteoporosis. This information is not intended to replace advice given to you by your health care provider. Make sure you discuss any questions you have with your health care provider. Document Revised: 11/05/2019 Document Reviewed: 11/05/2019 Elsevier Patient Education  2023 [employer].

## 2022-09-25 ENCOUNTER — Ambulatory Visit (INDEPENDENT_AMBULATORY_CARE_PROVIDER_SITE_OTHER): Payer: Medicare PPO | Admitting: Nurse Practitioner

## 2022-09-25 ENCOUNTER — Encounter: Payer: Self-pay | Admitting: Nurse Practitioner

## 2022-09-25 VITALS — BP 138/64 | HR 58 | Temp 98.0°F | Ht <= 58 in | Wt 115.6 lb

## 2022-09-25 DIAGNOSIS — D508 Other iron deficiency anemias: Secondary | ICD-10-CM | POA: Diagnosis not present

## 2022-09-25 DIAGNOSIS — M8588 Other specified disorders of bone density and structure, other site: Secondary | ICD-10-CM

## 2022-09-25 DIAGNOSIS — I10 Essential (primary) hypertension: Secondary | ICD-10-CM

## 2022-09-25 DIAGNOSIS — E782 Mixed hyperlipidemia: Secondary | ICD-10-CM

## 2022-09-25 MED ORDER — AMLODIPINE BESYLATE 10 MG PO TABS: ORAL_TABLET | ORAL | 4 refills | Status: DC

## 2022-09-25 NOTE — Progress Notes (Addendum)
BP 138/64 (BP Location: Left Arm, Patient Position: Sitting, Cuff Size: Normal)   Pulse (!) 58   Temp 98 F (36.7 C) (Oral)   Ht 4' 9.99" (1.473 m)   Wt 115 lb 9.6 oz (52.4 kg)   LMP  (LMP Unknown)   SpO2 95%   BMI 24.17 kg/m    Subjective:    Patient ID: Judith Hickman, female    DOB: 1933/08/29, 89 y.o.   MRN: [id number]  HPI: Judith Hickman is a 89 y.o. female  Chief Complaint  Patient presents with   Hypertension   Hyperlipidemia   Osteopenia   HYPERTENSION / HYPERLIPIDEMIA Continues on Amlodipine, Metoprolol XL, and HCTZ.  No current statin, does not wish to start. Satisfied with current treatment? yes Duration of hypertension: chronic BP monitoring frequency: a couple times a week BP range: 130/70-80 range BP medication side effects: no Duration of hyperlipidemia: chronic Aspirin: no Recent stressors: no Recurrent headaches: no Visual changes: no Palpitations: no Dyspnea: no Chest pain: no Lower extremity edema: no Dizzy/lightheaded: no  The ASCVD Risk score (Arnett DK, et al., 2019) failed to calculate for the following reasons:   The 2019 ASCVD risk score is only valid for ages 28 to 25  OSTEOPENIA On scan 01/01/18 noting osteopenia with T-score -2.0.  Continues Vit D and Evista.  No recent falls. Satisfied with current treatment?: yes Medication side effects: no Medication compliance: good compliance Past osteoporosis medications/treatments: Evista Adequate calcium & vitamin D: yes Intolerance to bisphosphonates:no Weight bearing exercises: yes  ANEMIA Continues iron daily for history of low iron levels.   Anemia status: stable Etiology of anemia: iron deficiency Duration of anemia treatment: many years Compliance with treatment: good compliance Iron supplementation side effects: no Severity of anemia: mild Fatigue: no Decreased exercise tolerance: no  Dyspnea on exertion: no Palpitations: no Bleeding: no Pica: no   Relevant past  medical, surgical, family and social history reviewed and updated as indicated. Interim medical history since our last visit reviewed. Allergies and medications reviewed and updated.  Review of Systems  Constitutional:  Negative for activity change, appetite change, diaphoresis, fatigue and fever.  Respiratory:  Negative for cough, chest tightness and shortness of breath.   Cardiovascular:  Negative for chest pain, palpitations and leg swelling.  Gastrointestinal: Negative.   Neurological: Negative.   Psychiatric/Behavioral: Negative.      Per HPI unless specifically indicated above     Objective:    BP 138/64 (BP Location: Left Arm, Patient Position: Sitting, Cuff Size: Normal)   Pulse (!) 58   Temp 98 F (36.7 C) (Oral)   Ht 4' 9.99" (1.473 m)   Wt 115 lb 9.6 oz (52.4 kg)   LMP  (LMP Unknown)   SpO2 95%   BMI 24.17 kg/m   Wt Readings from Last 3 Encounters:  09/25/22 115 lb 9.6 oz (52.4 kg)  03/26/22 117 lb 14.4 oz (53.5 kg)  09/05/21 115 lb 6.4 oz (52.3 kg)    Physical Exam Vitals and nursing note reviewed.  Constitutional:      General: She is awake. She is not in acute distress.    Appearance: She is well-developed and well-groomed. She is not ill-appearing or toxic-appearing.  HENT:     Head: Normocephalic.     Right Ear: Hearing normal.     Left Ear: Hearing normal.  Eyes:     General: Lids are normal.        Right eye: No discharge.  Left eye: No discharge.     Conjunctiva/sclera: Conjunctivae normal.     Pupils: Pupils are equal, round, and reactive to light.  Neck:     Thyroid: No thyromegaly.     Vascular: No carotid bruit.  Cardiovascular:     Rate and Rhythm: Regular rhythm. Bradycardia present.     Heart sounds: Normal heart sounds. No murmur heard.    No gallop.  Pulmonary:     Effort: Pulmonary effort is normal.     Breath sounds: Normal breath sounds.  Abdominal:     General: Bowel sounds are normal.     Palpations: Abdomen is soft.   Musculoskeletal:     Cervical back: Normal range of motion and neck supple.     Right lower leg: No edema.     Left lower leg: No edema.  Lymphadenopathy:     Cervical: No cervical adenopathy.  Skin:    General: Skin is warm and dry.  Neurological:     Mental Status: She is alert and oriented to person, place, and time.     Deep Tendon Reflexes: Reflexes are normal and symmetric.     Reflex Scores:      Brachioradialis reflexes are 2+ on the right side and 2+ on the left side.      Patellar reflexes are 2+ on the right side and 2+ on the left side. Psychiatric:        Attention and Perception: Attention normal.        Mood and Affect: Mood normal.        Speech: Speech normal.        Behavior: Behavior normal. Behavior is cooperative.        Thought Content: Thought content normal.    Results for orders placed or performed in visit on 03/26/22  Basic metabolic panel  Result Value Ref Range   Glucose 103 (H) 70 - 99 mg/dL   BUN 21 8 - 27 mg/dL   Creatinine, Ser 1.61 0.57 - 1.00 mg/dL   eGFR 68 >09 UE/AVW/0.98   BUN/Creatinine Ratio 25 12 - 28   Sodium 143 134 - 144 mmol/L   Potassium 4.3 3.5 - 5.2 mmol/L   Chloride 101 96 - 106 mmol/L   CO2 23 20 - 29 mmol/L   Calcium 9.7 8.7 - 10.3 mg/dL  Microalbumin, Urine Waived  Result Value Ref Range   Microalb, Ur Waived 30 (H) 0 - 19 mg/L   Creatinine, Urine Waived 200 10 - 300 mg/dL   Microalb/Creat Ratio <30 <30 mg/g      Assessment & Plan:   Problem List Items Addressed This Visit       Cardiovascular and Mediastinum   Essential hypertension, benign - Primary    Chronic, stable.  BP below goal for age on recheck today and on home readings.  Continue current medication regimen and adjust as needed.  Recommend she notify provider if BP ever consistently runs lower at home or has dizzy spells, then may reduce medication.  Continue to monitor BP at home and focus on DASH diet.  LABS: CBC, CMP, TSH. Return in 6 months.       Relevant Medications   amLODipine (NORVASC) 10 MG tablet   Other Relevant Orders   Comprehensive metabolic panel   TSH     Musculoskeletal and Integument   Osteopenia    Ongoing.  Bone density -2.0.  Repeat in July 2024, provided her instructions to schedule.  Taking Evista, continue this  and Vitamin D.  Recheck Vit D level.      Relevant Orders   VITAMIN D 25 Hydroxy (Vit-D Deficiency, Fractures)   DG Bone Density     Other   Fe deficiency anemia    Chronic, stable with daily iron intake.  Check CBC and iron level, recent iron level stable.  Continue current supplement.  Return in 6 months.      Relevant Orders   CBC with Differential/Platelet   Iron Binding Cap (TIBC)([facility])   Hyperlipidemia    Chronic, no current statin.  Refuses to initiate, which is appropriate at this time based on age.  Lipid panel today and continue diet focus at home.      Relevant Medications   amLODipine (NORVASC) 10 MG tablet   Other Relevant Orders   Comprehensive metabolic panel   Lipid Panel w/o Chol/HDL Ratio     Follow up plan: Return in about 6 months (around 03/27/2023) for HTN/HLD, OSTEOPENIA.

## 2022-09-25 NOTE — Assessment & Plan Note (Signed)
Chronic, no current statin.  Refuses to initiate, which is appropriate at this time based on age.  Lipid panel today and continue diet focus at home. 

## 2022-09-25 NOTE — Assessment & Plan Note (Signed)
Ongoing.  Bone density -2.0.  Repeat in July 2024, provided her instructions to schedule.  Taking Evista, continue this and Vitamin D.  Recheck Vit D level.

## 2022-09-25 NOTE — Assessment & Plan Note (Signed)
Chronic, stable with daily iron intake.  Check CBC and iron level, recent iron level stable.  Continue current supplement.  Return in 6 months.

## 2022-09-25 NOTE — Assessment & Plan Note (Signed)
Chronic, stable.  BP below goal for age on recheck today and on home readings.  Continue current medication regimen and adjust as needed.  Recommend she notify provider if BP ever consistently runs lower at home or has dizzy spells, then may reduce medication.  Continue to monitor BP at home and focus on DASH diet.  LABS: CBC, CMP, TSH. Return in 6 months.

## 2022-09-26 LAB — IRON AND TIBC
Iron Saturation: 30 % (ref 15–55)
Iron: 88 ug/dL (ref 27–139)
Total Iron Binding Capacity: 294 ug/dL (ref 250–450)
UIBC: 206 ug/dL (ref 118–369)

## 2022-09-26 LAB — CBC WITH DIFFERENTIAL/PLATELET
Basophils Absolute: 0 10*3/uL (ref 0.0–0.2)
Basos: 0 %
EOS (ABSOLUTE): 0.2 10*3/uL (ref 0.0–0.4)
Eos: 3 %
Hematocrit: 40.9 % (ref 34.0–46.6)
Hemoglobin: 13.6 g/dL (ref 11.1–15.9)
Immature Grans (Abs): 0 10*3/uL (ref 0.0–0.1)
Immature Granulocytes: 0 %
Lymphocytes Absolute: 2.1 10*3/uL (ref 0.7–3.1)
Lymphs: 33 %
MCH: 31 pg (ref 26.6–33.0)
MCHC: 33.3 g/dL (ref 31.5–35.7)
MCV: 93 fL (ref 79–97)
Monocytes Absolute: 0.3 10*3/uL (ref 0.1–0.9)
Monocytes: 4 %
Neutrophils Absolute: 3.8 10*3/uL (ref 1.4–7.0)
Neutrophils: 60 %
Platelets: 260 10*3/uL (ref 150–450)
RBC: 4.39 x10E6/uL (ref 3.77–5.28)
RDW: 12.3 % (ref 11.7–15.4)
WBC: 6.4 10*3/uL (ref 3.4–10.8)

## 2022-09-26 LAB — LIPID PANEL W/O CHOL/HDL RATIO
Cholesterol, Total: 212 mg/dL — ABNORMAL HIGH (ref 100–199)
HDL: 61 mg/dL (ref >39)
LDL Chol Calc (NIH): 127 mg/dL — ABNORMAL HIGH (ref 0–99)
Triglycerides: 139 mg/dL (ref 0–149)
VLDL Cholesterol Cal: 24 mg/dL (ref 5–40)

## 2022-09-26 LAB — COMPREHENSIVE METABOLIC PANEL
ALT: 13 IU/L (ref 0–32)
AST: 17 IU/L (ref 0–40)
Albumin/Globulin Ratio: 1.8 (ref 1.2–2.2)
Albumin: 4.3 g/dL (ref 3.7–4.7)
Alkaline Phosphatase: 69 IU/L (ref 44–121)
BUN/Creatinine Ratio: 28 (ref 12–28)
BUN: 23 mg/dL (ref 8–27)
Bilirubin Total: 0.2 mg/dL (ref 0.0–1.2)
CO2: 23 mmol/L (ref 20–29)
Calcium: 9.7 mg/dL (ref 8.7–10.3)
Chloride: 103 mmol/L (ref 96–106)
Creatinine, Ser: 0.81 mg/dL (ref 0.57–1.00)
Globulin, Total: 2.4 g/dL (ref 1.5–4.5)
Glucose: 94 mg/dL (ref 70–99)
Potassium: 4.8 mmol/L (ref 3.5–5.2)
Sodium: 141 mmol/L (ref 134–144)
Total Protein: 6.7 g/dL (ref 6.0–8.5)
eGFR: 71 mL/min/{1.73_m2} (ref >59)

## 2022-09-26 LAB — VITAMIN D 25 HYDROXY (VIT D DEFICIENCY, FRACTURES): Vit D, 25-Hydroxy: 50.7 ng/mL (ref 30.0–100.0)

## 2022-09-26 LAB — TSH: TSH: 2.16 u[IU]/mL (ref 0.450–4.500)

## 2022-09-26 NOTE — Progress Notes (Signed)
Good morning, please let Judith Hickman know her labs have returned.  Overall these are stable with kidney and liver function normal and blood counts showing normal iron level and stable anemia.  Her cholesterol labs are elevated, but at 88 we focus more on diet and staying active vs starting medication.  Any questions? Keep being amazing!!  Thank you for allowing me to participate in your care.  I appreciate you. Kindest regards, Elodie Panameno

## 2022-10-02 ENCOUNTER — Telehealth: Payer: Self-pay | Admitting: Nurse Practitioner

## 2022-10-02 NOTE — Telephone Encounter (Signed)
Contacted Westley Hummer to schedule their annual wellness visit. Appointment made for 10/23/2022.  Verlee Rossetti; Care Guide Ambulatory Clinical Support [facility] Direct Dial: [phone]

## 2022-10-29 ENCOUNTER — Other Ambulatory Visit: Payer: Self-pay | Admitting: Nurse Practitioner

## 2022-10-30 NOTE — Telephone Encounter (Signed)
Requested Prescriptions  Refused Prescriptions Disp Refills   amLODipine (NORVASC) 10 MG tablet [Pharmacy Med Name: AMLODIPINE BESYLATE 10MG  TABLETS] 90 tablet 4    Sig: TAKE 1 TABLET(10 MG) BY MOUTH DAILY     Cardiovascular: Calcium Channel Blockers 2 Passed - 10/29/2022  3:14 AM      Passed - Last BP in normal range    BP Readings from Last 1 Encounters:  09/25/22 138/64         Passed - Last Heart Rate in normal range    Pulse Readings from Last 1 Encounters:  09/25/22 (!) 58         Passed - Valid encounter within last 6 months    Recent Outpatient Visits           1 month ago Essential hypertension, benign   [place] [facility] [place], [place] T, NP   7 months ago Essential hypertension, benign   [place] [facility] [place], Corrie Dandy T, NP   1 year ago Essential hypertension, benign   [place] [facility] [place], Corrie Dandy T, NP   1 year ago Essential hypertension, benign   [place] [facility] [place], Corrie Dandy T, NP   2 years ago Essential hypertension, benign   [place] [facility] [place], Dorie Rank, NP       Future Appointments             In 4 months [name], Dorie Rank, NP [facility]

## 2022-11-05 ENCOUNTER — Ambulatory Visit (INDEPENDENT_AMBULATORY_CARE_PROVIDER_SITE_OTHER): Payer: Medicare PPO

## 2022-11-05 VITALS — Wt 115.0 lb

## 2022-11-05 DIAGNOSIS — Z0000 Encounter for general adult medical examination without abnormal findings: Secondary | ICD-10-CM

## 2022-11-05 NOTE — Patient Instructions (Signed)
Ms. Judith Hickman , Thank you for taking time to come for your Medicare Wellness Visit. I appreciate your ongoing commitment to your health goals. Please review the following plan we discussed and let me know if I can assist you in the future.   These are the goals we discussed:  Goals      DIET - EAT MORE FRUITS AND VEGETABLES     DIET - INCREASE WATER INTAKE     Recommend drinking at least 6-8 glasses of water a day      Patient Stated     10/10/2020, eat healthier     Patient Stated     Continue current lifestyle         This is a list of the screening recommended for you and due dates:  Health Maintenance  Topic Date Due   COVID-19 Vaccine (4 - 2023-24 season) 02/02/2022   Zoster (Shingles) Vaccine (1 of 2) 12/25/2022*   DEXA scan (bone density measurement)  01/02/2023   Flu Shot  01/03/2023   Medicare Annual Wellness Visit  11/05/2023   DTaP/Tdap/Td vaccine (2 - Td or Tdap) 06/29/2030   Pneumonia Vaccine  Completed   HPV Vaccine  Aged Out  *Topic was postponed. The date shown is not the original due date.    Advanced directives: no  Conditions/risks identified: none  Next appointment: Follow up in one year for your annual wellness visit 11/11/23 @ 8:45 am by phone   Preventive Care 65 Years and Older, Female Preventive care refers to lifestyle choices and visits with your health care provider that can promote health and wellness. What does preventive care include? A yearly physical exam. This is also called an annual well check. Dental exams once or twice a year. Routine eye exams. Ask your health care provider how often you should have your eyes checked. Personal lifestyle choices, including: Daily care of your teeth and gums. Regular physical activity. Eating a healthy diet. Avoiding tobacco and drug use. Limiting alcohol use. Practicing safe sex. Taking low-dose aspirin every day. Taking vitamin and mineral supplements as recommended by your health care  provider. What happens during an annual well check? The services and screenings done by your health care provider during your annual well check will depend on your age, overall health, lifestyle risk factors, and family history of disease. Counseling  Your health care provider may ask you questions about your: Alcohol use. Tobacco use. Drug use. Emotional well-being. Home and relationship well-being. Sexual activity. Eating habits. History of falls. Memory and ability to understand (cognition). Work and work Astronomer. Reproductive health. Screening  You may have the following tests or measurements: Height, weight, and BMI. Blood pressure. Lipid and cholesterol levels. These may be checked every 5 years, or more frequently if you are over 25 years old. Skin check. Lung cancer screening. You may have this screening every year starting at age 57 if you have a 30-pack-year history of smoking and currently smoke or have quit within the past 15 years. Fecal occult blood test (FOBT) of the stool. You may have this test every year starting at age 54. Flexible sigmoidoscopy or colonoscopy. You may have a sigmoidoscopy every 5 years or a colonoscopy every 10 years starting at age 35. Hepatitis C blood test. Hepatitis B blood test. Sexually transmitted disease (STD) testing. Diabetes screening. This is done by checking your blood sugar (glucose) after you have not eaten for a while (fasting). You may have this done every 1-3 years. Bone density  scan. This is done to screen for osteoporosis. You may have this done starting at age 44. Mammogram. This may be done every 1-2 years. Talk to your health care provider about how often you should have regular mammograms. Talk with your health care provider about your test results, treatment options, and if necessary, the need for more tests. Vaccines  Your health care provider may recommend certain vaccines, such as: Influenza vaccine. This is  recommended every year. Tetanus, diphtheria, and acellular pertussis (Tdap, Td) vaccine. You may need a Td booster every 10 years. Zoster vaccine. You may need this after age 70. Pneumococcal 13-valent conjugate (PCV13) vaccine. One dose is recommended after age 13. Pneumococcal polysaccharide (PPSV23) vaccine. One dose is recommended after age 66. Talk to your health care provider about which screenings and vaccines you need and how often you need them. This information is not intended to replace advice given to you by your health care provider. Make sure you discuss any questions you have with your health care provider. Document Released: 06/17/2015 Document Revised: 02/08/2016 Document Reviewed: 03/22/2015 Elsevier Interactive Patient Education  2017 [employer].  Fall Prevention in the Home Falls can cause injuries. They can happen to people of all ages. There are many things you can do to make your home safe and to help prevent falls. What can I do on the outside of my home? Regularly fix the edges of walkways and driveways and fix any cracks. Remove anything that might make you trip as you walk through a door, such as a raised step or threshold. Trim any bushes or trees on the path to your home. Use bright outdoor lighting. Clear any walking paths of anything that might make someone trip, such as rocks or tools. Regularly check to see if handrails are loose or broken. Make sure that both sides of any steps have handrails. Any raised decks and porches should have guardrails on the edges. Have any leaves, snow, or ice cleared regularly. Use sand or salt on walking paths during winter. Clean up any spills in your garage right away. This includes oil or grease spills. What can I do in the bathroom? Use night lights. Install grab bars by the toilet and in the tub and shower. Do not use towel bars as grab bars. Use non-skid mats or decals in the tub or shower. If you need to sit down in  the shower, use a plastic, non-slip stool. Keep the floor dry. Clean up any water that spills on the floor as soon as it happens. Remove soap buildup in the tub or shower regularly. Attach bath mats securely with double-sided non-slip rug tape. Do not have throw rugs and other things on the floor that can make you trip. What can I do in the bedroom? Use night lights. Make sure that you have a light by your bed that is easy to reach. Do not use any sheets or blankets that are too big for your bed. They should not hang down onto the floor. Have a firm chair that has side arms. You can use this for support while you get dressed. Do not have throw rugs and other things on the floor that can make you trip. What can I do in the kitchen? Clean up any spills right away. Avoid walking on wet floors. Keep items that you use a lot in easy-to-reach places. If you need to reach something above you, use a strong step stool that has a grab bar. Keep electrical  cords out of the way. Do not use floor polish or wax that makes floors slippery. If you must use wax, use non-skid floor wax. Do not have throw rugs and other things on the floor that can make you trip. What can I do with my stairs? Do not leave any items on the stairs. Make sure that there are handrails on both sides of the stairs and use them. Fix handrails that are broken or loose. Make sure that handrails are as long as the stairways. Check any carpeting to make sure that it is firmly attached to the stairs. Fix any carpet that is loose or worn. Avoid having throw rugs at the top or bottom of the stairs. If you do have throw rugs, attach them to the floor with carpet tape. Make sure that you have a light switch at the top of the stairs and the bottom of the stairs. If you do not have them, ask someone to add them for you. What else can I do to help prevent falls? Wear shoes that: Do not have high heels. Have rubber bottoms. Are comfortable  and fit you well. Are closed at the toe. Do not wear sandals. If you use a stepladder: Make sure that it is fully opened. Do not climb a closed stepladder. Make sure that both sides of the stepladder are locked into place. Ask someone to hold it for you, if possible. Clearly mark and make sure that you can see: Any grab bars or handrails. First and last steps. Where the edge of each step is. Use tools that help you move around (mobility aids) if they are needed. These include: Canes. Walkers. Scooters. Crutches. Turn on the lights when you go into a dark area. Replace any light bulbs as soon as they burn out. Set up your furniture so you have a clear path. Avoid moving your furniture around. If any of your floors are uneven, fix them. If there are any pets around you, be aware of where they are. Review your medicines with your doctor. Some medicines can make you feel dizzy. This can increase your chance of falling. Ask your doctor what other things that you can do to help prevent falls. This information is not intended to replace advice given to you by your health care provider. Make sure you discuss any questions you have with your health care provider. Document Released: 03/17/2009 Document Revised: 10/27/2015 Document Reviewed: 06/25/2014 Elsevier Interactive Patient Education  2017 [employer].

## 2022-11-05 NOTE — Progress Notes (Signed)
I connected with  Westley Hummer on 11/05/22 by a audio enabled telemedicine application and verified that I am speaking with the correct person using two identifiers.  Patient Location: Home  Provider Location: Office/Clinic  I discussed the limitations of evaluation and management by telemedicine. The patient expressed understanding and agreed to proceed.  Subjective:   Judith Hickman is a 89 y.o. female who presents for Medicare Annual (Subsequent) preventive examination.  Review of Systems     Cardiac Risk Factors include: advanced age (>37men, >65 women);hypertension;family history of premature cardiovascular disease     Objective:    Today's Vitals   11/05/22 1142  Weight: 115 lb (52.2 kg)   Body mass index is 24.04 kg/m.     11/05/2022   11:35 AM 10/16/2021   11:14 AM 10/10/2020    1:00 PM 09/24/2018   11:39 AM 09/18/2017    1:19 PM 08/21/2016    1:05 PM 07/27/2015    2:22 PM  Advanced Directives  Does Patient Have a Medical Advance Directive? No No Yes Yes Yes No No  Type of Chief of Staff of [place];Living will Healthcare Power of Attorney    Copy of Healthcare Power of Attorney in Chart?    Yes - validated most recent copy scanned in chart (See row information) Yes    Would patient like information on creating a medical advance directive? No - Patient declined No - Patient declined    No - Patient declined Yes - Educational materials given    Current Medications (verified) Outpatient Encounter Medications as of 11/05/2022  Medication Sig   amLODipine (NORVASC) 10 MG tablet TAKE 1 TABLET(10 MG) BY MOUTH DAILY   hydrochlorothiazide (MICROZIDE) 12.5 MG capsule TAKE 1 CAPSULE BY MOUTH EVERY DAY   IRON PO Take by mouth.    metoprolol succinate (TOPROL-XL) 50 MG 24 hr tablet TAKE 1 TABLET BY MOUTH EVERY DAY WITH OR IMMEDIATELY FOLLOWING A MEAL   raloxifene (EVISTA) 60 MG tablet TAKE 1 TABLET(60 MG) BY MOUTH DAILY    VITAMIN D, CHOLECALCIFEROL, PO Take by mouth.    No facility-administered encounter medications on file as of 11/05/2022.    Allergies (verified) Lisinopril   History: Past Medical History:  Diagnosis Date   Back pain    Chronic kidney disease    Costalchondritis    Hyperlipidemia    Hypertension    Hypertensive CKD (chronic kidney disease)    Iron deficiency anemia    Mitral valve regurgitation    Osteoporosis    Rotator cuff syndrome of left shoulder    Past Surgical History:  Procedure Laterality Date   ABDOMINAL HYSTERECTOMY     BLADDER REPAIR     X 2   CARPAL TUNNEL RELEASE     Family History  Problem Relation Age of Onset   Diabetes Mother    Heart disease Father    Social History   Socioeconomic History   Marital status: Divorced    Spouse name: Not on file   Number of children: Not on file   Years of education: Not on file   Highest education level: 9th grade  Occupational History   Not on file  Tobacco Use   Smoking status: Never   Smokeless tobacco: Never  Vaping Use   Vaping Use: Never used  Substance and Sexual Activity   Alcohol use: No    Alcohol/week: 0.0 standard drinks of alcohol   Drug use: No  Sexual activity: Not Currently  Other Topics Concern   Not on file  Social History Narrative   Not on file   Social Determinants of Health   Financial Resource Strain: Low Risk  (11/05/2022)   Overall Financial Resource Strain (CARDIA)    Difficulty of Paying Living Expenses: Not hard at all  Food Insecurity: No Food Insecurity (11/05/2022)   Hunger Vital Sign    Worried About Running Out of Food in the Last Year: Never true    Ran Out of Food in the Last Year: Never true  Transportation Needs: No Transportation Needs (11/05/2022)   PRAPARE - Administrator, Civil Service (Medical): No    Lack of Transportation (Non-Medical): No  Physical Activity: Insufficiently Active (11/05/2022)   Exercise Vital Sign    Days of Exercise per Week:  7 days    Minutes of Exercise per Session: 20 min  Stress: No Stress Concern Present (11/05/2022)   [employer] of Occupational Health - Occupational Stress Questionnaire    Feeling of Stress : Not at all  Social Connections: Moderately Isolated (11/05/2022)   Social Connection and Isolation Panel [NHANES]    Frequency of Communication with Friends and Family: More than three times a week    Frequency of Social Gatherings with Friends and Family: Once a week    Attends Religious Services: More than 4 times per year    Active Member of [employer] or Organizations: No    Attends Engineer, structural: Never    Marital Status: Divorced    Tobacco Counseling Counseling given: Not Answered   Clinical Intake:  Pre-visit preparation completed: Yes  Pain : No/denies pain     Nutritional Risks: None Diabetes: No  How often do you need to have someone help you when you read instructions, pamphlets, or other written materials from your doctor or pharmacy?: 1 - Never  Diabetic?no  Interpreter Needed?: No  Information entered by :: Kennedy Bucker, LPN   Activities of Daily Living    11/05/2022   11:36 AM  In your present state of health, do you have any difficulty performing the following activities:  Hearing? 0  Vision? 0  Difficulty concentrating or making decisions? 0  Walking or climbing stairs? 0  Dressing or bathing? 0  Doing errands, shopping? 0  Preparing Food and eating ? N  Using the Toilet? N  In the past six months, have you accidently leaked urine? N  Do you have problems with loss of bowel control? N  Managing your Medications? N  Managing your Finances? N  Housekeeping or managing your Housekeeping? N    Patient Care Team: Marjie Skiff, NP as PCP - General (Nurse Practitioner)  Indicate any recent Medical Services you may have received from other than [facility] providers in the past year (date may be approximate).     Assessment:   This is a  routine wellness examination for Judith Hickman.  Hearing/Vision screen Hearing Screening - Comments:: No aids Vision Screening - Comments:: Readers- [facility]  Dietary issues and exercise activities discussed: Current Exercise Habits: Home exercise routine, Type of exercise: walking, Time (Minutes): 20, Frequency (Times/Week): 7, Weekly Exercise (Minutes/Week): 140, Intensity: Mild   Goals Addressed             This Visit's Progress    DIET - EAT MORE FRUITS AND VEGETABLES         Depression Screen    11/05/2022   11:33 AM 09/25/2022  1:59 PM 03/26/2022    2:21 PM 10/16/2021   11:23 AM 09/05/2021   12:57 PM 10/10/2020    1:01 PM 06/29/2020    3:45 PM  PHQ 2/9 Scores  PHQ - 2 Score 0 0 0 0 0 0 0  PHQ- 9 Score 0 0 0 0 0  0    Fall Risk    11/05/2022   11:35 AM 09/25/2022    1:59 PM 03/26/2022    2:21 PM 10/16/2021   11:15 AM 09/05/2021    1:02 PM  Fall Risk   Falls in the past year? 0 0 0 0 0  Number falls in past yr: 0 0 0 0 0  Injury with Fall? 0 0 0 0 0  Risk for fall due to : No Fall Risks No Fall Risks No Fall Risks  No Fall Risks  Follow up Falls prevention discussed;Falls evaluation completed Falls evaluation completed Falls evaluation completed Falls evaluation completed;Education provided;Falls prevention discussed Falls evaluation completed    FALL RISK PREVENTION PERTAINING TO THE HOME:  Any stairs in or around the home? Yes  If so, are there any without handrails? No  Home free of loose throw rugs in walkways, pet beds, electrical cords, etc? Yes  Adequate lighting in your home to reduce risk of falls? Yes   ASSISTIVE DEVICES UTILIZED TO PREVENT FALLS:  Life alert? No  Use of a cane, walker or w/c? No  Grab bars in the bathroom? No  Shower chair or bench in shower? No  Elevated toilet seat or a handicapped toilet? No    Cognitive Function:        11/05/2022   11:38 AM 10/16/2021   11:16 AM 10/10/2020    1:02 PM 09/24/2018   11:40 AM 09/18/2017    1:19 PM   6CIT Screen  What Year? 0 points 0 points 0 points 0 points 0 points  What month? 0 points 0 points 0 points 0 points 0 points  What time? 0 points 0 points 0 points 0 points 0 points  Count back from 20 0 points 0 points 0 points 0 points 0 points  Months in reverse 2 points 0 points 0 points 0 points 0 points  Repeat phrase 0 points 0 points 2 points 0 points 0 points  Total Score 2 points 0 points 2 points 0 points 0 points    Immunizations Immunization History  Administered Date(s) Administered   Fluad Quad(high Dose 65+) 05/08/2020, 03/26/2022   Influenza, High Dose Seasonal PF 02/27/2017, 03/25/2018, 05/14/2019   Influenza-Unspecified 03/09/2014, 06/07/2015, 04/09/2016   PFIZER(Purple Top)SARS-COV-2 Vaccination 06/16/2019, 07/07/2019, 03/10/2020   Pneumococcal Conjugate-13 07/27/2015   Pneumococcal Polysaccharide-23 03/01/2008   Tdap 06/29/2020   Zoster, Live 03/01/2008    TDAP status: Up to date  Flu Vaccine status: Up to date  Pneumococcal vaccine status: Up to date  Covid-19 vaccine status: Completed vaccines  Qualifies for Shingles Vaccine? Yes   Zostavax completed Yes   Shingrix Completed?: No.    Education has been provided regarding the importance of this vaccine. Patient has been advised to call insurance company to determine out of pocket expense if they have not yet received this vaccine. Advised may also receive vaccine at local pharmacy or Health Dept. Verbalized acceptance and understanding.  Screening Tests Health Maintenance  Topic Date Due   COVID-19 Vaccine (4 - 2023-24 season) 02/02/2022   Zoster Vaccines- Shingrix (1 of 2) 12/25/2022 (Originally 05/24/1984)   DEXA SCAN  01/02/2023  INFLUENZA VACCINE  01/03/2023   Medicare Annual Wellness (AWV)  11/05/2023   DTaP/Tdap/Td (2 - Td or Tdap) 06/29/2030   Pneumonia Vaccine 78+ Years old  Completed   HPV VACCINES  Aged Out    Health Maintenance  Health Maintenance Due  Topic Date Due   COVID-19  Vaccine (4 - 2023-24 season) 02/02/2022    Colorectal cancer screening: No longer required.   Mammogram status: No longer required due to age.   Lung Cancer Screening: (Low Dose CT Chest recommended if Age 7-80 years, 30 pack-year currently smoking OR have quit w/in 15years.) does not qualify.    Additional Screening:  Hepatitis C Screening: does not qualify; Completed no  Vision Screening: Recommended annual ophthalmology exams for early detection of glaucoma and other disorders of the eye. Is the patient up to date with their annual eye exam?  Yes  Who is the provider or what is the name of the office in which the patient attends annual eye exams? [facility] If pt is not established with a provider, would they like to be referred to a provider to establish care? No .   Dental Screening: Recommended annual dental exams for proper oral hygiene  Community Resource Referral / Chronic Care Management: CRR required this visit?  No   CCM required this visit?  No      Plan:     I have personally reviewed and noted the following in the patient's chart:   Medical and social history Use of alcohol, tobacco or illicit drugs  Current medications and supplements including opioid prescriptions. Patient is not currently taking opioid prescriptions. Functional ability and status Nutritional status Physical activity Advanced directives List of other physicians Hospitalizations, surgeries, and ER visits in previous 12 months Vitals Screenings to include cognitive, depression, and falls Referrals and appointments  In addition, I have reviewed and discussed with patient certain preventive protocols, quality metrics, and best practice recommendations. A written personalized care plan for preventive services as well as general preventive health recommendations were provided to patient.     Hal Hope, LPN   06/09/1094   Nurse Notes: none

## 2022-12-24 ENCOUNTER — Other Ambulatory Visit: Payer: Self-pay | Admitting: Nurse Practitioner

## 2022-12-25 NOTE — Telephone Encounter (Signed)
Requested Prescriptions  Refused Prescriptions Disp Refills   hydrochlorothiazide (MICROZIDE) 12.5 MG capsule [Pharmacy Med Name: HYDROCHLOROTHIAZIDE 12.5MG  CAPSULES] 90 capsule 4    Sig: TAKE 1 CAPSULE BY MOUTH EVERY DAY     Cardiovascular: Diuretics - Thiazide Passed - 12/24/2022 10:09 AM      Passed - Cr in normal range and within 180 days    Creatinine  Date Value Ref Range Status  10/20/2013 0.75 0.60 - 1.30 mg/dL Final   Creatinine, Ser  Date Value Ref Range Status  09/25/2022 0.81 0.57 - 1.00 mg/dL Final         Passed - K in normal range and within 180 days    Potassium  Date Value Ref Range Status  09/25/2022 4.8 3.5 - 5.2 mmol/L Final  10/20/2013 4.2 3.5 - 5.1 mmol/L Final         Passed - Na in normal range and within 180 days    Sodium  Date Value Ref Range Status  09/25/2022 141 134 - 144 mmol/L Final  10/20/2013 139 136 - 145 mmol/L Final         Passed - Last BP in normal range    BP Readings from Last 1 Encounters:  09/25/22 138/64         Passed - Valid encounter within last 6 months    Recent Outpatient Visits           3 months ago Essential hypertension, benign   [place] [facility] [place], Corrie Dandy T, NP   9 months ago Essential hypertension, benign   [place] [facility] [place], Corrie Dandy T, NP   1 year ago Essential hypertension, benign   [place] [facility] [place], Corrie Dandy T, NP   1 year ago Essential hypertension, benign   [place] [facility] [place], Corrie Dandy T, NP   2 years ago Essential hypertension, benign   [place] [facility] [place], [place] T, NP       Future Appointments             In 3 months [name], [place] T, NP [place] [facility]             raloxifene (EVISTA) 60 MG tablet [Pharmacy Med Name: RALOXIFENE 60MG  TABLETS] 90 tablet 4    Sig: TAKE 1 TABLET(60 MG) BY MOUTH DAILY     OB/GYN: Selective Estrogen Receptor  Modulators 2 Failed - 12/24/2022 10:09 AM      Failed - Bone Mineral Density or Dexa Scan completed in the last 2 years      Passed - Ca in normal range and within 360 days    Calcium  Date Value Ref Range Status  09/25/2022 9.7 8.7 - 10.3 mg/dL Final   Calcium, Total  Date Value Ref Range Status  10/20/2013 9.1 8.5 - 10.1 mg/dL Final         Passed - Valid encounter within last 12 months    Recent Outpatient Visits           3 months ago Essential hypertension, benign   [place] [facility] [place], [place] T, NP   9 months ago Essential hypertension, benign   [place] [facility] [place], [place] T, NP   1 year ago Essential hypertension, benign   [place] [facility] [place], Corrie Dandy T, NP   1 year ago Essential hypertension, benign   [place] [facility] [place], Dorie Rank, NP  2 years ago Essential hypertension, benign   [place] [facility] [place], Dorie Rank, NP       Future Appointments             In 3 months [name], [place] T, NP [place] [facility]             metoprolol succinate (TOPROL-XL) 50 MG 24 hr tablet [Pharmacy Med Name: METOPROLOL ER SUCCINATE 50MG  TABS] 90 tablet 4    Sig: TAKE 1 TABLET BY MOUTH EVERY DAY WITH OR IMMEDIATELY FOLLOWING A MEAL     Cardiovascular:  Beta Blockers Passed - 12/24/2022 10:09 AM      Passed - Last BP in normal range    BP Readings from Last 1 Encounters:  09/25/22 138/64         Passed - Last Heart Rate in normal range    Pulse Readings from Last 1 Encounters:  09/25/22 (!) 58         Passed - Valid encounter within last 6 months    Recent Outpatient Visits           3 months ago Essential hypertension, benign   [place] [facility] [place], [place] T, NP   9 months ago Essential hypertension, benign   [place] [facility] [place], Corrie Dandy T, NP   1 year ago Essential hypertension,  benign   [place] [facility] [place], Corrie Dandy T, NP   1 year ago Essential hypertension, benign   [place] [facility] [place], Corrie Dandy T, NP   2 years ago Essential hypertension, benign   [place] [facility] [place], Dorie Rank, NP       Future Appointments             In 3 months [name], Dorie Rank, NP [place] [facility]

## 2023-03-24 NOTE — Patient Instructions (Incomplete)

## 2023-03-27 ENCOUNTER — Ambulatory Visit (INDEPENDENT_AMBULATORY_CARE_PROVIDER_SITE_OTHER): Payer: Medicare PPO | Admitting: Nurse Practitioner

## 2023-03-27 ENCOUNTER — Encounter: Payer: Self-pay | Admitting: Nurse Practitioner

## 2023-03-27 VITALS — BP 127/60 | HR 54 | Temp 98.1°F | Ht <= 58 in | Wt 115.4 lb

## 2023-03-27 DIAGNOSIS — R052 Subacute cough: Secondary | ICD-10-CM | POA: Diagnosis not present

## 2023-03-27 DIAGNOSIS — I10 Essential (primary) hypertension: Secondary | ICD-10-CM

## 2023-03-27 DIAGNOSIS — Z23 Encounter for immunization: Secondary | ICD-10-CM | POA: Diagnosis not present

## 2023-03-27 DIAGNOSIS — E782 Mixed hyperlipidemia: Secondary | ICD-10-CM | POA: Diagnosis not present

## 2023-03-27 DIAGNOSIS — M8588 Other specified disorders of bone density and structure, other site: Secondary | ICD-10-CM

## 2023-03-27 MED ORDER — METOPROLOL SUCCINATE ER 50 MG PO TB24: ORAL_TABLET | ORAL | 4 refills | Status: DC

## 2023-03-27 MED ORDER — HYDROCHLOROTHIAZIDE 12.5 MG PO CAPS: ORAL_CAPSULE | ORAL | 4 refills | Status: DC

## 2023-03-27 MED ORDER — RALOXIFENE HCL 60 MG PO TABS: ORAL_TABLET | ORAL | 4 refills | Status: DC

## 2023-03-27 MED ORDER — AMOXICILLIN-POT CLAVULANATE 875-125 MG PO TABS: 1.0000 | ORAL_TABLET | Freq: Two times a day (BID) | ORAL | 0 refills | Status: AC

## 2023-03-27 NOTE — Assessment & Plan Note (Signed)
Chronic, no current statin.  Refuses to initiate, which is appropriate at this time based on age.  Lipid panel today and continue diet focus at home. 

## 2023-03-27 NOTE — Assessment & Plan Note (Addendum)
Ongoing.  Bone density -2.0.  Repeat in July 2024, provided her instructions to schedule.  Taking Evista, continue this and Vitamin D.

## 2023-03-27 NOTE — Progress Notes (Signed)
BP 127/60   Pulse (!) 54   Temp 98.1 F (36.7 C) (Oral)   Ht 4\' 10"  (1.473 m)   Wt 115 lb 6.4 oz (52.3 kg)   LMP  (LMP Unknown)   SpO2 93%   BMI 24.12 kg/m    Subjective:    Patient ID: Judith Hickman, female    DOB: October 01, 1933, 89 y.o.   MRN: [id number]  HPI: Judith Hickman is a 89 y.o. female  Chief Complaint  Patient presents with   Hypertension   Hyperlipidemia   Osteopenia   HYPERTENSION / HYPERLIPIDEMIA Continues on Amlodipine, Metoprolol XL, and HCTZ.  No current statin, does not wish to start. Satisfied with current treatment? yes Duration of hypertension: chronic BP monitoring frequency: a couple times a week BP range: 120/70-80 range BP medication side effects: no Duration of hyperlipidemia: chronic Aspirin: no Recent stressors: no Recurrent headaches: no Visual changes: no Palpitations: no Dyspnea: no Chest pain: no Lower extremity edema: no Dizzy/lightheaded: no  The ASCVD Risk score (Arnett DK, et al., 2019) failed to calculate for the following reasons:   The 2019 ASCVD risk score is only valid for ages 24 to 32  OSTEOPENIA 2019 scan noted osteopenia with T-score -2.0.  Continues Vit D and Evista.  No recent falls.  Has a visit 04/17/23 for repeat DEXA. Satisfied with current treatment?: yes Medication side effects: no Medication compliance: good compliance Past osteoporosis medications/treatments: Evista Adequate calcium & vitamin D: yes Intolerance to bisphosphonates:no Weight bearing exercises: yes  UPPER RESPIRATORY TRACT INFECTION Has ongoing cough since sinus infection about 2 weeks ago.   Fever: no Cough: yes Shortness of breath: no Wheezing: no Chest pain: no Chest tightness: no Chest congestion: no Nasal congestion: no Runny nose: no Post nasal drip: no Sneezing: no Sore throat: no Swollen glands: no Sinus pressure: no Headache: no Face pain: no Toothache: no Ear pain: none Ear pressure: none Eyes  red/itching:no Eye drainage/crusting: no  Vomiting: no Rash: no Fatigue: yes Sick contacts: no Strep contacts: no  Context: fluctuating Recurrent sinusitis: no Relief with OTC cold/cough medications: no  Treatments attempted: none    Relevant past medical, surgical, family and social history reviewed and updated as indicated. Interim medical history since our last visit reviewed. Allergies and medications reviewed and updated.  Review of Systems  Constitutional:  Negative for activity change, appetite change, diaphoresis, fatigue and fever.  Respiratory:  Positive for cough. Negative for chest tightness, shortness of breath and wheezing.   Cardiovascular:  Negative for chest pain, palpitations and leg swelling.  Gastrointestinal: Negative.   Neurological: Negative.   Psychiatric/Behavioral: Negative.      Per HPI unless specifically indicated above     Objective:    BP 127/60   Pulse (!) 54   Temp 98.1 F (36.7 C) (Oral)   Ht 4\' 10"  (1.473 m)   Wt 115 lb 6.4 oz (52.3 kg)   LMP  (LMP Unknown)   SpO2 93%   BMI 24.12 kg/m   Wt Readings from Last 3 Encounters:  03/27/23 115 lb 6.4 oz (52.3 kg)  11/05/22 115 lb (52.2 kg)  09/25/22 115 lb 9.6 oz (52.4 kg)    Physical Exam Vitals and nursing note reviewed.  Constitutional:      General: She is awake. She is not in acute distress.    Appearance: She is well-developed and well-groomed. She is not ill-appearing or toxic-appearing.  HENT:     Head: Normocephalic.  Right Ear: Hearing, tympanic membrane, ear canal and external ear normal.     Left Ear: Hearing, tympanic membrane, ear canal and external ear normal.     Nose: No rhinorrhea.     Right Sinus: No maxillary sinus tenderness or frontal sinus tenderness.     Left Sinus: No maxillary sinus tenderness or frontal sinus tenderness.     Mouth/Throat:     Mouth: Mucous membranes are moist.     Pharynx: No pharyngeal swelling, oropharyngeal exudate or posterior  oropharyngeal erythema.  Eyes:     General: Lids are normal.        Right eye: No discharge.        Left eye: No discharge.     Conjunctiva/sclera: Conjunctivae normal.     Pupils: Pupils are equal, round, and reactive to light.  Neck:     Thyroid: No thyromegaly.     Vascular: No carotid bruit.  Cardiovascular:     Rate and Rhythm: Regular rhythm. Bradycardia present.     Heart sounds: Normal heart sounds. No murmur heard.    No gallop.  Pulmonary:     Effort: Pulmonary effort is normal. No accessory muscle usage or respiratory distress.     Breath sounds: Wheezing present. No decreased breath sounds or rhonchi.     Comments: Expiratory wheezes noted throughout, no rhonchi. Abdominal:     General: Bowel sounds are normal. There is no distension.     Palpations: Abdomen is soft.     Tenderness: There is no abdominal tenderness.  Musculoskeletal:     Cervical back: Normal range of motion and neck supple.     Right lower leg: No edema.     Left lower leg: No edema.  Lymphadenopathy:     Cervical: No cervical adenopathy.  Skin:    General: Skin is warm and dry.  Neurological:     Mental Status: She is alert and oriented to person, place, and time.     Deep Tendon Reflexes: Reflexes are normal and symmetric.     Reflex Scores:      Brachioradialis reflexes are 2+ on the right side and 2+ on the left side.      Patellar reflexes are 2+ on the right side and 2+ on the left side. Psychiatric:        Attention and Perception: Attention normal.        Mood and Affect: Mood normal.        Speech: Speech normal.        Behavior: Behavior normal. Behavior is cooperative.        Thought Content: Thought content normal.    Results for orders placed or performed in visit on 09/25/22  Comprehensive metabolic panel  Result Value Ref Range   Glucose 94 70 - 99 mg/dL   BUN 23 8 - 27 mg/dL   Creatinine, Ser 3.24 0.57 - 1.00 mg/dL   eGFR 71 >40 NU/UVO/5.36   BUN/Creatinine Ratio 28 12 -  28   Sodium 141 134 - 144 mmol/L   Potassium 4.8 3.5 - 5.2 mmol/L   Chloride 103 96 - 106 mmol/L   CO2 23 20 - 29 mmol/L   Calcium 9.7 8.7 - 10.3 mg/dL   Total Protein 6.7 6.0 - 8.5 g/dL   Albumin 4.3 3.7 - 4.7 g/dL   Globulin, Total 2.4 1.5 - 4.5 g/dL   Albumin/Globulin Ratio 1.8 1.2 - 2.2   Bilirubin Total 0.2 0.0 - 1.2 mg/dL   Alkaline  Phosphatase 69 44 - 121 IU/L   AST 17 0 - 40 IU/L   ALT 13 0 - 32 IU/L  Lipid Panel w/o Chol/HDL Ratio  Result Value Ref Range   Cholesterol, Total 212 (H) 100 - 199 mg/dL   Triglycerides 409 0 - 149 mg/dL   HDL 61 >81 mg/dL   VLDL Cholesterol Cal 24 5 - 40 mg/dL   LDL Chol Calc (NIH) 191 (H) 0 - 99 mg/dL  VITAMIN D 25 Hydroxy (Vit-D Deficiency, Fractures)  Result Value Ref Range   Vit D, 25-Hydroxy 50.7 30.0 - 100.0 ng/mL  CBC with Differential/Platelet  Result Value Ref Range   WBC 6.4 3.4 - 10.8 x10E3/uL   RBC 4.39 3.77 - 5.28 x10E6/uL   Hemoglobin 13.6 11.1 - 15.9 g/dL   Hematocrit 47.8 29.5 - 46.6 %   MCV 93 79 - 97 fL   MCH 31.0 26.6 - 33.0 pg   MCHC 33.3 31.5 - 35.7 g/dL   RDW 62.1 30.8 - 65.7 %   Platelets 260 150 - 450 x10E3/uL   Neutrophils 60 Not Estab. %   Lymphs 33 Not Estab. %   Monocytes 4 Not Estab. %   Eos 3 Not Estab. %   Basos 0 Not Estab. %   Neutrophils Absolute 3.8 1.4 - 7.0 x10E3/uL   Lymphocytes Absolute 2.1 0.7 - 3.1 x10E3/uL   Monocytes Absolute 0.3 0.1 - 0.9 x10E3/uL   EOS (ABSOLUTE) 0.2 0.0 - 0.4 x10E3/uL   Basophils Absolute 0.0 0.0 - 0.2 x10E3/uL   Immature Granulocytes 0 Not Estab. %   Immature Grans (Abs) 0.0 0.0 - 0.1 x10E3/uL  TSH  Result Value Ref Range   TSH 2.160 0.450 - 4.500 uIU/mL  Iron Binding Cap (TIBC)([facility])  Result Value Ref Range   Total Iron Binding Capacity 294 250 - 450 ug/dL   UIBC 846 962 - 952 ug/dL   Iron 88 27 - 841 ug/dL   Iron Saturation 30 15 - 55 %      Assessment & Plan:   Problem List Items Addressed This Visit       Cardiovascular and Mediastinum    Essential hypertension, benign - Primary    Chronic, stable.  BP below goal for age in office and at home.  Continue current medication regimen and adjust as needed.  Recommend she notify provider if BP ever consistently runs lower at home or has dizzy spells, then may reduce medication.  Continue to monitor BP at home and focus on DASH diet.  LABS: CMP. Return in 6 months.      Relevant Medications   hydrochlorothiazide (MICROZIDE) 12.5 MG capsule   metoprolol succinate (TOPROL-XL) 50 MG 24 hr tablet   Other Relevant Orders   Comprehensive metabolic panel     Musculoskeletal and Integument   Osteopenia    Ongoing.  Bone density -2.0.  Repeat in July 2024, provided her instructions to schedule.  Taking Evista, continue this and Vitamin D.          Other   Hyperlipidemia    Chronic, no current statin.  Refuses to initiate, which is appropriate at this time based on age.  Lipid panel today and continue diet focus at home.      Relevant Medications   hydrochlorothiazide (MICROZIDE) 12.5 MG capsule   metoprolol succinate (TOPROL-XL) 50 MG 24 hr tablet   Other Relevant Orders   Comprehensive metabolic panel   Other Visit Diagnoses     Subacute cough  For 2 weeks with some expiratory wheezes on exam.  Will start Augmentin and recommend she use Coricidin OTC.  Return if worsening.  No red flags.   Flu vaccine need       Flu vaccine today, educated on this.   Relevant Orders   Flu Vaccine Trivalent High Dose (Fluad) (Completed)   Need for COVID-19 vaccine       Need for Covid -- educated her on this.   Relevant Orders   Pfizer Comirnaty Covid -19 Vaccine 45yrs and older (Completed)        Follow up plan: Return in about 6 months (around 09/25/2023) for HTN/HLD, OSTEOPENIA.

## 2023-03-27 NOTE — Assessment & Plan Note (Signed)
Chronic, stable.  BP below goal for age in office and at home.  Continue current medication regimen and adjust as needed.  Recommend she notify provider if BP ever consistently runs lower at home or has dizzy spells, then may reduce medication.  Continue to monitor BP at home and focus on DASH diet.  LABS: CMP. Return in 6 months.

## 2023-03-28 LAB — COMPREHENSIVE METABOLIC PANEL
ALT: 9 [IU]/L (ref 0–32)
AST: 16 [IU]/L (ref 0–40)
Albumin: 4.3 g/dL (ref 3.7–4.7)
Alkaline Phosphatase: 64 [IU]/L (ref 44–121)
BUN/Creatinine Ratio: 30 — ABNORMAL HIGH (ref 12–28)
BUN: 26 mg/dL (ref 8–27)
Bilirubin Total: 0.3 mg/dL (ref 0.0–1.2)
CO2: 23 mmol/L (ref 20–29)
Calcium: 9.7 mg/dL (ref 8.7–10.3)
Chloride: 102 mmol/L (ref 96–106)
Creatinine, Ser: 0.88 mg/dL (ref 0.57–1.00)
Globulin, Total: 2.3 g/dL (ref 1.5–4.5)
Glucose: 115 mg/dL — ABNORMAL HIGH (ref 70–99)
Potassium: 4.3 mmol/L (ref 3.5–5.2)
Sodium: 141 mmol/L (ref 134–144)
Total Protein: 6.6 g/dL (ref 6.0–8.5)
eGFR: 64 mL/min/{1.73_m2} (ref >59)

## 2023-03-28 NOTE — Progress Notes (Signed)
Good morning, please let Teran know her labs have returned and they are overall stable.  No changes needed.  Great job!! Keep being inspiring!!  Thank you for allowing me to participate in your care.  I appreciate you. Kindest regards, Daliana Leverett

## 2023-04-17 ENCOUNTER — Ambulatory Visit
Admission: RE | Admit: 2023-04-17 | Discharge: 2023-04-17 | Disposition: A | Discharge status: Home / Self Care | Payer: Medicare PPO | Source: Ambulatory Visit | Attending: Nurse Practitioner | Admitting: Nurse Practitioner

## 2023-04-17 DIAGNOSIS — M8588 Other specified disorders of bone density and structure, other site: Secondary | ICD-10-CM | POA: Insufficient documentation

## 2023-04-17 DIAGNOSIS — M8589 Other specified disorders of bone density and structure, multiple sites: Secondary | ICD-10-CM | POA: Diagnosis not present

## 2023-04-17 DIAGNOSIS — Z780 Asymptomatic menopausal state: Secondary | ICD-10-CM | POA: Diagnosis not present

## 2023-04-17 NOTE — Progress Notes (Signed)
Good afternoon, please let Anaalicia know her bone density continues to show thinning of bone (osteopenia) a tad worse on this check.  Please ensure you are taking Vitamin D3 2000 units daily and getting good calcium intake.  Any questions? Keep being amazing!!  Thank you for allowing me to participate in your care.  I appreciate you. Kindest regards, Casin Federici

## 2023-09-19 ENCOUNTER — Other Ambulatory Visit: Payer: Self-pay | Admitting: Nurse Practitioner

## 2023-09-20 NOTE — Telephone Encounter (Signed)
 Requested Prescriptions  Refused Prescriptions Disp Refills   hydrochlorothiazide  (MICROZIDE ) 12.5 MG capsule [Pharmacy Med Name: HYDROCHLOROTHIAZIDE  12.5MG  CAPSULES] 90 capsule 4    Sig: TAKE 1 CAPSULE BY MOUTH EVERY DAY     Cardiovascular: Diuretics - Thiazide Failed - 09/20/2023 10:06 AM      Failed - Valid encounter within last 6 months    Recent Outpatient Visits   None     Future Appointments             In 5 days Lemar Pyles, NP [facility]            Passed - Cr in normal range and within 180 days    Creatinine  Date Value Ref Range Status  10/20/2013 0.75 0.60 - 1.30 mg/dL Final   Creatinine, Ser  Date Value Ref Range Status  03/27/2023 0.88 0.57 - 1.00 mg/dL Final         Passed - K in normal range and within 180 days    Potassium  Date Value Ref Range Status  03/27/2023 4.3 3.5 - 5.2 mmol/L Final  10/20/2013 4.2 3.5 - 5.1 mmol/L Final         Passed - Na in normal range and within 180 days    Sodium  Date Value Ref Range Status  03/27/2023 141 134 - 144 mmol/L Final  10/20/2013 139 136 - 145 mmol/L Final         Passed - Last BP in normal range    BP Readings from Last 1 Encounters:  03/27/23 127/60

## 2023-09-22 NOTE — Patient Instructions (Incomplete)
 Voltaren Gel or Diclofenac gel over the counter, use as instructed.  Be Involved in Caring For Your Health:  Taking Medications When medications are taken as directed, they can greatly improve your health. But if they are not taken as prescribed, they may not work. In some cases, not taking them correctly can be harmful. To help ensure your treatment remains effective and safe, understand your medications and how to take them. Bring your medications to each visit for review by your provider.  Your lab results, notes, and after visit summary will be available on My Chart. We strongly encourage you to use this feature. If lab results are abnormal the clinic will contact you with the appropriate steps. If the clinic does not contact you assume the results are satisfactory. You can always view your results on My Chart. If you have questions regarding your health or results, please contact the clinic during office hours. You can also ask questions on My Chart.  We at [facility] are grateful that you chose us  to provide your care. We strive to provide evidence-based and compassionate care and are always looking for feedback. If you get a survey from the clinic please complete this so we can hear your opinions.  DASH Eating Plan DASH stands for Dietary Approaches to Stop Hypertension. The DASH eating plan is a healthy eating plan that has been shown to: Lower high blood pressure (hypertension). Reduce your risk for type 2 diabetes, heart disease, and stroke. Help with weight loss. What are tips for following this plan? Reading food labels Check food labels for the amount of salt (sodium) per serving. Choose foods with less than 5 percent of the Daily Value (DV) of sodium. In general, foods with less than 300 milligrams (mg) of sodium per serving fit into this eating plan. To find whole grains, look for the word "whole" as the first word in the ingredient list. Shopping Buy products labeled  as "low-sodium" or "no salt added." Buy fresh foods. Avoid canned foods and pre-made or frozen meals. Cooking Try not to add salt when you cook. Use salt-free seasonings or herbs instead of table salt or sea salt. Check with your health care provider or pharmacist before using salt substitutes. Do not fry foods. Cook foods in healthy ways, such as baking, boiling, grilling, roasting, or broiling. Cook using oils that are good for your heart. These include olive, canola, avocado, soybean, and sunflower oil. Meal planning  Eat a balanced diet. This should include: 4 or more servings of fruits and 4 or more servings of vegetables each day. Try to fill half of your plate with fruits and vegetables. 6-8 servings of whole grains each day. 6 or less servings of lean meat, poultry, or fish each day. 1 oz is 1 serving. A 3 oz (85 g) serving of meat is about the same size as the palm of your hand. One egg is 1 oz (28 g). 2-3 servings of low-fat dairy each day. One serving is 1 cup (237 mL). 1 serving of nuts, seeds, or beans 5 times each week. 2-3 servings of heart-healthy fats. Healthy fats called omega-3 fatty acids are found in foods such as walnuts, flaxseeds, fortified milks, and eggs. These fats are also found in cold-water fish, such as sardines, salmon, and mackerel. Limit how much you eat of: Canned or prepackaged foods. Food that is high in trans fat, such as fried foods. Food that is high in saturated fat, such as fatty meat. Desserts  and other sweets, sugary drinks, and other foods with added sugar. Full-fat dairy products. Do not salt foods before eating. Do not eat more than 4 egg yolks a week. Try to eat at least 2 vegetarian meals a week. Eat more home-cooked food and less restaurant, buffet, and fast food. Lifestyle When eating at a restaurant, ask if your food can be made with less salt or no salt. If you drink alcohol: Limit how much you have to: 0-1 drink a day if you are  female. 0-2 drinks a day if you are female. Know how much alcohol is in your drink. In the [place] one drink is one 12 oz bottle of beer (355 mL), one 5 oz glass of wine (148 mL), or one 1 oz glass of hard liquor (44 mL). General information Avoid eating more than 2,300 mg of salt a day. If you have hypertension, you may need to reduce your sodium intake to 1,500 mg a day. Work with your provider to stay at a healthy body weight or lose weight. Ask what the best weight range is for you. On most days of the week, get at least 30 minutes of exercise that causes your heart to beat faster. This may include walking, swimming, or biking. Work with your provider or dietitian to adjust your eating plan to meet your specific calorie needs. What foods should I eat? Fruits All fresh, dried, or frozen fruit. Canned fruits that are in their natural juice and do not have sugar added to them. Vegetables Fresh or frozen vegetables that are raw, steamed, roasted, or grilled. Low-sodium or reduced-sodium tomato and vegetable juice. Low-sodium or reduced-sodium tomato sauce and tomato paste. Low-sodium or reduced-sodium canned vegetables. Grains Whole-grain or whole-wheat bread. Whole-grain or whole-wheat pasta. Brown rice. Dwyane Glad. Bulgur. Whole-grain and low-sodium cereals. Pita bread. Low-fat, low-sodium crackers. Whole-wheat flour tortillas. Meats and other proteins Skinless chicken or [place]. Ground chicken or [place]. Pork with fat trimmed off. Fish and seafood. Egg whites. Dried beans, peas, or lentils. Unsalted nuts, nut butters, and seeds. Unsalted canned beans. Lean cuts of beef with fat trimmed off. Low-sodium, lean precooked or cured meat, such as sausages or meat loaves. Dairy Low-fat (1%) or fat-free (skim) milk. Reduced-fat, low-fat, or fat-free cheeses. Nonfat, low-sodium ricotta or cottage cheese. Low-fat or nonfat yogurt. Low-fat, low-sodium cheese. Fats and oils Soft margarine without trans  fats. Vegetable oil. Reduced-fat, low-fat, or light mayonnaise and salad dressings (reduced-sodium). Canola, safflower, olive, avocado, soybean, and sunflower oils. Avocado. Seasonings and condiments Herbs. Spices. Seasoning mixes without salt. Other foods Unsalted popcorn and pretzels. Fat-free sweets. The items listed above may not be all the foods and drinks you can have. Talk to a dietitian to learn more. What foods should I avoid? Fruits Canned fruit in a light or heavy syrup. Fried fruit. Fruit in cream or butter sauce. Vegetables Creamed or fried vegetables. Vegetables in a cheese sauce. Regular canned vegetables that are not marked as low-sodium or reduced-sodium. Regular canned tomato sauce and paste that are not marked as low-sodium or reduced-sodium. Regular tomato and vegetable juices that are not marked as low-sodium or reduced-sodium. Vanessa General. Olives. Grains Baked goods made with fat, such as croissants, muffins, or some breads. Dry pasta or rice meal packs. Meats and other proteins Fatty cuts of meat. Ribs. Fried meat. Helene Loader. Bologna, salami, and other precooked or cured meats, such as sausages or meat loaves, that are not lean and low in sodium. Fat from the back of a pig (  fatback). Bratwurst. Salted nuts and seeds. Canned beans with added salt. Canned or smoked fish. Whole eggs or egg yolks. Chicken or [place] with skin. Dairy Whole or 2% milk, cream, and half-and-half. Whole or full-fat cream cheese. Whole-fat or sweetened yogurt. Full-fat cheese. Nondairy creamers. Whipped toppings. Processed cheese and cheese spreads. Fats and oils Butter. Stick margarine. Lard. Shortening. Ghee. Bacon fat. Tropical oils, such as coconut, palm kernel, or palm oil. Seasonings and condiments Onion salt, garlic salt, seasoned salt, table salt, and sea salt. Worcestershire sauce. Tartar sauce. Barbecue sauce. Teriyaki sauce. Soy sauce, including reduced-sodium soy sauce. Steak sauce. Canned and  packaged gravies. Fish sauce. Oyster sauce. Cocktail sauce. Store-bought horseradish. Ketchup. Mustard. Meat flavorings and tenderizers. Bouillon cubes. Hot sauces. Pre-made or packaged marinades. Pre-made or packaged taco seasonings. Relishes. Regular salad dressings. Other foods Salted popcorn and pretzels. The items listed above may not be all the foods and drinks you should avoid. Talk to a dietitian to learn more. Where to find more information National Heart, Lung, and Blood Institute (NHLBI): BuffaloDryCleaner.gl American Heart Association (AHA): heart.org Academy of Nutrition and Dietetics: eatright.org National Kidney Foundation (NKF): kidney.org This information is not intended to replace advice given to you by your health care provider. Make sure you discuss any questions you have with your health care provider. Document Revised: 06/07/2022 Document Reviewed: 06/07/2022 Elsevier Patient Education  2024 [employer].

## 2023-09-25 ENCOUNTER — Encounter: Payer: Self-pay | Admitting: Nurse Practitioner

## 2023-09-25 ENCOUNTER — Ambulatory Visit (INDEPENDENT_AMBULATORY_CARE_PROVIDER_SITE_OTHER): Payer: Self-pay | Admitting: Nurse Practitioner

## 2023-09-25 VITALS — BP 115/61 | HR 61 | Temp 98.9°F | Ht <= 58 in | Wt 118.0 lb

## 2023-09-25 DIAGNOSIS — M8588 Other specified disorders of bone density and structure, other site: Secondary | ICD-10-CM | POA: Diagnosis not present

## 2023-09-25 DIAGNOSIS — E782 Mixed hyperlipidemia: Secondary | ICD-10-CM | POA: Diagnosis not present

## 2023-09-25 DIAGNOSIS — I10 Essential (primary) hypertension: Secondary | ICD-10-CM

## 2023-09-25 DIAGNOSIS — D508 Other iron deficiency anemias: Secondary | ICD-10-CM

## 2023-09-25 DIAGNOSIS — M25512 Pain in left shoulder: Secondary | ICD-10-CM

## 2023-09-25 MED ORDER — AMLODIPINE BESYLATE 10 MG PO TABS: ORAL_TABLET | ORAL | 4 refills | Status: DC

## 2023-09-25 MED ORDER — METOPROLOL SUCCINATE ER 50 MG PO TB24: ORAL_TABLET | ORAL | 4 refills | Status: DC

## 2023-09-25 MED ORDER — RALOXIFENE HCL 60 MG PO TABS: ORAL_TABLET | ORAL | 4 refills | Status: DC

## 2023-09-25 MED ORDER — HYDROCHLOROTHIAZIDE 12.5 MG PO CAPS: ORAL_CAPSULE | ORAL | 4 refills | Status: DC

## 2023-09-25 NOTE — Progress Notes (Signed)
 BP 115/61   Pulse 61   Temp 98.9 F (37.2 C) (Oral)   Ht 4\' 10"  (1.473 m)   Wt 118 lb (53.5 kg)   LMP  (LMP Unknown)   SpO2 97%   BMI 24.66 kg/m    Subjective:    Patient ID: Judith Hickman, female    DOB: [birth year], 89 y.o.   MRN: [id number]  HPI: Judith Hickman is a 89 y.o. female  Chief Complaint  Patient presents with   Hyperlipidemia   Hypertension   HYPERTENSION / HYPERLIPIDEMIA Takes Amlodipine , Metoprolol  XL, and HCTZ.  No current statin, does not wish to start. Satisfied with current treatment? yes Duration of hypertension: chronic BP monitoring frequency: two times a week BP range: 120/70 range BP medication side effects: no Duration of hyperlipidemia: chronic Aspirin: no Recent stressors: no Recurrent headaches: no Visual changes: no Palpitations: no Dyspnea: no Chest pain: no Lower extremity edema: no Dizzy/lightheaded: no  The ASCVD Risk score (Arnett DK, et al., 2019) failed to calculate for the following reasons:   The 2019 ASCVD risk score is only valid for ages 88 to 40  OSTEOPENIA 2019 scan noted osteopenia with T-score -2.0.  Continues Vit D and Evista .  No recent falls or fractures Satisfied with current treatment?: yes Medication side effects: no Medication compliance: good compliance Past osteoporosis medications/treatments: Evista  Adequate calcium & vitamin D : yes Intolerance to bisphosphonates:no Weight bearing exercises: yes  ANEMIA Anemia status: controlled Etiology of anemia: iron deficiency Duration of anemia treatment: chronic Compliance with treatment: good compliance Iron supplementation side effects: no Severity of anemia: mild Fatigue: no Decreased exercise tolerance: no  Dyspnea on exertion: no Palpitations: no Bleeding: no Pica: no  SHOULDER PAIN Left side, after went to pick something up and twisted shoulder, felt something pop at the time. Feels this is improving a whole lot, but not 100%. Does not hurt  until she tries to lift arm up.  Right hand dominant, but can also use left hand. Duration: days Involved shoulder: left Mechanism of injury: unknown Location: anterior Onset:sudden Severity: 5/10  Quality:  dull, aching, and throbbing Frequency: intermittent Radiation: no Aggravating factors: lifting and movement  Alleviating factors: rest, Tylenol  Status: stable Treatments attempted: rest, Tylenol, and ice  Relief with NSAIDs?:  No NSAIDs Taken Weakness: no Numbness: no Decreased grip strength: no Redness: no Swelling: no Bruising: no Fevers: no    Relevant past medical, surgical, family and social history reviewed and updated as indicated. Interim medical history since our last visit reviewed. Allergies and medications reviewed and updated.  Review of Systems  Constitutional:  Negative for activity change, appetite change, diaphoresis, fatigue and fever.  Respiratory:  Negative for cough, chest tightness, shortness of breath and wheezing.   Cardiovascular:  Negative for chest pain, palpitations and leg swelling.  Gastrointestinal: Negative.   Endocrine: Negative for cold intolerance and heat intolerance.  Musculoskeletal:  Positive for arthralgias.  Neurological: Negative.   Psychiatric/Behavioral: Negative.      Per HPI unless specifically indicated above     Objective:    BP 115/61   Pulse 61   Temp 98.9 F (37.2 C) (Oral)   Ht 4\' 10"  (1.473 m)   Wt 118 lb (53.5 kg)   LMP  (LMP Unknown)   SpO2 97%   BMI 24.66 kg/m   Wt Readings from Last 3 Encounters:  09/25/23 118 lb (53.5 kg)  03/27/23 115 lb 6.4 oz (52.3 kg)  11/05/22 115 lb (52.2 kg)  Physical Exam Vitals and nursing note reviewed.  Constitutional:      General: She is awake. She is not in acute distress.    Appearance: She is well-developed and well-groomed. She is not ill-appearing or toxic-appearing.  HENT:     Head: Normocephalic.     Right Ear: Hearing and external ear normal.     Left  Ear: Hearing and external ear normal.  Eyes:     General: Lids are normal.        Right eye: No discharge.        Left eye: No discharge.     Conjunctiva/sclera: Conjunctivae normal.     Pupils: Pupils are equal, round, and reactive to light.  Neck:     Thyroid: No thyromegaly.     Vascular: No carotid bruit.  Cardiovascular:     Rate and Rhythm: Normal rate and regular rhythm.     Heart sounds: Normal heart sounds. No murmur heard.    No gallop.  Pulmonary:     Effort: Pulmonary effort is normal. No accessory muscle usage or respiratory distress.     Breath sounds: Normal breath sounds. No decreased breath sounds, wheezing or rales.  Abdominal:     General: Bowel sounds are normal. There is no distension.     Palpations: Abdomen is soft.     Tenderness: There is no abdominal tenderness.  Musculoskeletal:     Right shoulder: Normal.     Left shoulder: Tenderness (anterior aspect, mild) present. No swelling, laceration or crepitus. Normal range of motion. Decreased strength (mild). Normal pulse.     Cervical back: Normal range of motion and neck supple.     Right lower leg: No edema.     Left lower leg: No edema.     Comments: No reduced ROM, but does endorse mild discomfort with elevation of arm.  Lymphadenopathy:     Cervical: No cervical adenopathy.  Skin:    General: Skin is warm and dry.  Neurological:     Mental Status: She is alert and oriented to person, place, and time.     Deep Tendon Reflexes: Reflexes are normal and symmetric.     Reflex Scores:      Brachioradialis reflexes are 2+ on the right side and 2+ on the left side.      Patellar reflexes are 2+ on the right side and 2+ on the left side. Psychiatric:        Attention and Perception: Attention normal.        Mood and Affect: Mood normal.        Speech: Speech normal.        Behavior: Behavior normal. Behavior is cooperative.        Thought Content: Thought content normal.    Results for orders placed or  performed in visit on 03/27/23  Comprehensive metabolic panel   Collection Time: 03/27/23  1:38 PM  Result Value Ref Range   Glucose 115 (H) 70 - 99 mg/dL   BUN 26 8 - 27 mg/dL   Creatinine, Ser 4.09 0.57 - 1.00 mg/dL   eGFR 64 >81 XB/JYN/8.29   BUN/Creatinine Ratio 30 (H) 12 - 28   Sodium 141 134 - 144 mmol/L   Potassium 4.3 3.5 - 5.2 mmol/L   Chloride 102 96 - 106 mmol/L   CO2 23 20 - 29 mmol/L   Calcium 9.7 8.7 - 10.3 mg/dL   Total Protein 6.6 6.0 - 8.5 g/dL   Albumin 4.3 3.7 -  4.7 g/dL   Globulin, Total 2.3 1.5 - 4.5 g/dL   Bilirubin Total 0.3 0.0 - 1.2 mg/dL   Alkaline Phosphatase 64 44 - 121 IU/L   AST 16 0 - 40 IU/L   ALT 9 0 - 32 IU/L      Assessment & Plan:   Problem List Items Addressed This Visit       Cardiovascular and Mediastinum   Essential hypertension, benign - Primary   Chronic, stable.  BP below goal for age in office and at home.  Continue current medication regimen and adjust as needed.  Recommend she notify provider if BP ever consistently runs lower at home or has dizzy spells, then may reduce medication.  Continue to monitor BP at home and focus on DASH diet.  LABS: CBC, TSH, CMP.  Return in 6 months.      Relevant Medications   amLODipine  (NORVASC ) 10 MG tablet   hydrochlorothiazide  (MICROZIDE ) 12.5 MG capsule   metoprolol  succinate (TOPROL -XL) 50 MG 24 hr tablet   Other Relevant Orders   Comprehensive metabolic panel with GFR   TSH     Musculoskeletal and Integument   Osteopenia   Ongoing.  Bone density -2.2 on 04/17/23, slight reduction in density from previous in 2019.  Repeat in November 2029.  Taking Evista , continue this and Vitamin D .        Relevant Orders   VITAMIN D  25 Hydroxy (Vit-D Deficiency, Fractures)     Other   Left shoulder pain   Acute left shoulder pain after twisting arm the wrong way.  Overall exam reassuring.  ROM intact, mild tenderness with elevation.  She reports arm is much improving.  Will defer imaging or ortho  at this time, but discussed if not better by next wake to alert PCP and will place referral.  Recommend continue Tylenol and use Voltaren gel OTC.  Heat to area as needed.  Ensure gentle stretching daily.      Hyperlipidemia   Chronic, no current statin.  Refuses to initiate, which is appropriate at this time based on age.  Lipid panel today and continue diet focus at home.      Relevant Medications   amLODipine  (NORVASC ) 10 MG tablet   hydrochlorothiazide  (MICROZIDE ) 12.5 MG capsule   metoprolol  succinate (TOPROL -XL) 50 MG 24 hr tablet   Other Relevant Orders   Comprehensive metabolic panel with GFR   Lipid Panel w/o Chol/HDL Ratio   Fe deficiency anemia   Chronic, stable with daily iron intake.  Check CBC and iron level, recent iron level stable.  Continue current supplement.  Return in 6 months.      Relevant Orders   Ferritin   Iron   CBC with Differential/Platelet     Follow up plan: Return in about 6 months (around 03/26/2024) for HTN/HLD, ANEMIA.

## 2023-09-25 NOTE — Assessment & Plan Note (Signed)
 Ongoing.  Bone density -2.2 on 04/17/23, slight reduction in density from previous in 2019.  Repeat in November 2029.  Taking Evista , continue this and Vitamin D .

## 2023-09-25 NOTE — Assessment & Plan Note (Signed)
 Chronic, stable.  BP below goal for age in office and at home.  Continue current medication regimen and adjust as needed.  Recommend she notify provider if BP ever consistently runs lower at home or has dizzy spells, then may reduce medication.  Continue to monitor BP at home and focus on DASH diet.  LABS: CBC, TSH, CMP.  Return in 6 months.

## 2023-09-25 NOTE — Assessment & Plan Note (Signed)
Chronic, stable with daily iron intake.  Check CBC and iron level, recent iron level stable.  Continue current supplement.  Return in 6 months.

## 2023-09-25 NOTE — Assessment & Plan Note (Signed)
Chronic, no current statin.  Refuses to initiate, which is appropriate at this time based on age.  Lipid panel today and continue diet focus at home. 

## 2023-09-25 NOTE — Assessment & Plan Note (Signed)
 Acute left shoulder pain after twisting arm the wrong way.  Overall exam reassuring.  ROM intact, mild tenderness with elevation.  She reports arm is much improving.  Will defer imaging or ortho at this time, but discussed if not better by next wake to alert PCP and will place referral.  Recommend continue Tylenol and use Voltaren gel OTC.  Heat to area as needed.  Ensure gentle stretching daily.

## 2023-09-26 LAB — CBC WITH DIFFERENTIAL/PLATELET
Basophils Absolute: 0 10*3/uL (ref 0.0–0.2)
Basos: 1 %
EOS (ABSOLUTE): 0.3 10*3/uL (ref 0.0–0.4)
Eos: 4 %
Hematocrit: 38.5 % (ref 34.0–46.6)
Hemoglobin: 13 g/dL (ref 11.1–15.9)
Immature Grans (Abs): 0 10*3/uL (ref 0.0–0.1)
Immature Granulocytes: 0 %
Lymphocytes Absolute: 2.2 10*3/uL (ref 0.7–3.1)
Lymphs: 27 %
MCH: 30.9 pg (ref 26.6–33.0)
MCHC: 33.8 g/dL (ref 31.5–35.7)
MCV: 91 fL (ref 79–97)
Monocytes Absolute: 0.4 10*3/uL (ref 0.1–0.9)
Monocytes: 4 %
Neutrophils Absolute: 5.1 10*3/uL (ref 1.4–7.0)
Neutrophils: 64 %
Platelets: 270 10*3/uL (ref 150–450)
RBC: 4.21 x10E6/uL (ref 3.77–5.28)
RDW: 11.8 % (ref 11.7–15.4)
WBC: 8 10*3/uL (ref 3.4–10.8)

## 2023-09-26 LAB — COMPREHENSIVE METABOLIC PANEL WITH GFR
ALT: 15 IU/L (ref 0–32)
AST: 19 IU/L (ref 0–40)
Albumin: 4.6 g/dL (ref 3.7–4.7)
Alkaline Phosphatase: 60 IU/L (ref 44–121)
BUN/Creatinine Ratio: 24 (ref 12–28)
BUN: 21 mg/dL (ref 8–27)
Bilirubin Total: 0.2 mg/dL (ref 0.0–1.2)
CO2: 22 mmol/L (ref 20–29)
Calcium: 9.5 mg/dL (ref 8.7–10.3)
Chloride: 100 mmol/L (ref 96–106)
Creatinine, Ser: 0.88 mg/dL (ref 0.57–1.00)
Globulin, Total: 2.1 g/dL (ref 1.5–4.5)
Glucose: 107 mg/dL — ABNORMAL HIGH (ref 70–99)
Potassium: 4.5 mmol/L (ref 3.5–5.2)
Sodium: 139 mmol/L (ref 134–144)
Total Protein: 6.7 g/dL (ref 6.0–8.5)
eGFR: 64 mL/min/{1.73_m2} (ref >59)

## 2023-09-26 LAB — VITAMIN D 25 HYDROXY (VIT D DEFICIENCY, FRACTURES): Vit D, 25-Hydroxy: 60.4 ng/mL (ref 30.0–100.0)

## 2023-09-26 LAB — LIPID PANEL W/O CHOL/HDL RATIO
Cholesterol, Total: 207 mg/dL — ABNORMAL HIGH (ref 100–199)
HDL: 59 mg/dL (ref >39)
LDL Chol Calc (NIH): 116 mg/dL — ABNORMAL HIGH (ref 0–99)
Triglycerides: 182 mg/dL — ABNORMAL HIGH (ref 0–149)
VLDL Cholesterol Cal: 32 mg/dL (ref 5–40)

## 2023-09-26 LAB — TSH: TSH: 1.59 u[IU]/mL (ref 0.450–4.500)

## 2023-09-26 LAB — FERRITIN: Ferritin: 551 ng/mL — ABNORMAL HIGH (ref 15–150)

## 2023-09-26 LAB — IRON: Iron: 76 ug/dL (ref 27–139)

## 2023-09-26 NOTE — Progress Notes (Signed)
 Please let Judith Hickman know her labs have returned: - CBC remains stable with no anemia or infection.  Iron level also is stable, but ferritin level is elevated with slight trend up from last check.  I recommend to change taking iron supplement to every other day. - Lipid panel continues to show elevations in cholesterol and LDL, bad cholesterol.  I know you prefer not to start statin therapy, so continue focus on healthy diet and regular exercise. - Remainder of labs stable.  No further changes needed.  Any questions? Keep being amazing!!  Thank you for allowing me to participate in your care.  I appreciate you. Kindest regards, Resha Filippone

## 2024-01-22 ENCOUNTER — Other Ambulatory Visit: Payer: Self-pay | Admitting: Nurse Practitioner

## 2024-01-23 NOTE — Telephone Encounter (Signed)
 Refused metoprolol  50 mg because on 09/25/2023 #90 with 4 refills was given.

## 2024-01-31 ENCOUNTER — Ambulatory Visit

## 2024-02-15 DIAGNOSIS — H6123 Impacted cerumen, bilateral: Secondary | ICD-10-CM | POA: Diagnosis not present

## 2024-02-15 DIAGNOSIS — H938X3 Other specified disorders of ear, bilateral: Secondary | ICD-10-CM | POA: Diagnosis not present

## 2024-03-31 ENCOUNTER — Ambulatory Visit: Admitting: Nurse Practitioner

## 2024-05-09 ENCOUNTER — Other Ambulatory Visit: Payer: Self-pay | Admitting: Nurse Practitioner

## 2024-05-13 NOTE — Telephone Encounter (Signed)
 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 I explained that she would need a follow up for further refills.
